# Patient Record
Sex: Male | Born: 1990
Health system: Southern US, Community
[De-identification: ages and names within clinical notes are randomized; demographics above are authoritative.]

## PROBLEM LIST (undated history)

## (undated) DIAGNOSIS — Z8659 Personal history of other mental and behavioral disorders: Secondary | ICD-10-CM

## (undated) DIAGNOSIS — F419 Anxiety disorder, unspecified: Secondary | ICD-10-CM

## (undated) HISTORY — DX: Personal history of other mental and behavioral disorders: Z86.59

## (undated) HISTORY — PX: WRIST SURGERY: SHX841

## (undated) HISTORY — PX: OTHER SURGICAL HISTORY: SHX169

---

## 2012-08-17 ENCOUNTER — Emergency Department
Admission: EM | Admit: 2012-08-17 | Discharge: 2012-08-17 | Disposition: A | Discharge status: Home / Self Care | Payer: No Typology Code available for payment source | Source: Home / Self Care | Attending: Family Medicine | Admitting: Family Medicine

## 2012-08-17 ENCOUNTER — Emergency Department (INDEPENDENT_AMBULATORY_CARE_PROVIDER_SITE_OTHER): Payer: No Typology Code available for payment source

## 2012-08-17 DIAGNOSIS — S93402A Sprain of unspecified ligament of left ankle, initial encounter: Secondary | ICD-10-CM

## 2012-08-17 DIAGNOSIS — S92309A Fracture of unspecified metatarsal bone(s), unspecified foot, initial encounter for closed fracture: Secondary | ICD-10-CM

## 2012-08-17 DIAGNOSIS — X58XXXA Exposure to other specified factors, initial encounter: Secondary | ICD-10-CM

## 2012-08-17 DIAGNOSIS — M79609 Pain in unspecified limb: Secondary | ICD-10-CM

## 2012-08-17 DIAGNOSIS — S92302A Fracture of unspecified metatarsal bone(s), left foot, initial encounter for closed fracture: Secondary | ICD-10-CM

## 2012-08-17 DIAGNOSIS — S93409A Sprain of unspecified ligament of unspecified ankle, initial encounter: Secondary | ICD-10-CM

## 2012-08-17 NOTE — ED Notes (Signed)
Mitchell Shaw was playing basketball yesterday when he twisted his left foot. The pain is a 8/10, stabbing and throbbing pain. His left foot is swollen and is unable to walk on his foot.

## 2012-08-17 NOTE — ED Provider Notes (Signed)
History    CSN: [id number] Arrival date & time 08/17/12  1323  First MD Initiated Contact with Patient 08/17/12 1355     Chief Complaint  Patient presents with  . Foot Injury    yesterday      HPI Comments: While playing basketball yesterday, patient inverted his left foot/ankle and felt a sudden popping sensation in his left foot/ankle.  He has had persistent swelling and pain with weight bearing.  Patient is a 22 y.o. male presenting with foot injury. The history is provided by the patient.  Foot Injury Location:  Foot and ankle Time since incident:  1 day Injury: yes   Mechanism of injury comment:  Playing basketball Ankle location:  L ankle Foot location:  L foot Pain details:    Quality:  Throbbing and shooting   Radiates to:  Does not radiate   Severity:  Moderate   Onset quality:  Sudden   Duration:  1 day   Timing:  Constant   Progression:  Unchanged Chronicity:  New Dislocation: no   Prior injury to area:  No Relieved by:  Nothing Worsened by:  Bearing weight Ineffective treatments:  None tried Associated symptoms: decreased ROM, stiffness and swelling   Associated symptoms: no back pain, no fatigue, no muscle weakness, no numbness and no tingling    History reviewed. No pertinent past medical history. Past Surgical History  Procedure Laterality Date  . Bone graph     Family History  Problem Relation Age of Onset  . Rheum arthritis Mother   . Hypertension Other   . Stroke Other   . Hypertension Other    History  Substance Use Topics  . Smoking status: Current Every Day Smoker -- 1.00 packs/day for 6 years    Types: Cigarettes  . Smokeless tobacco: Never Used  . Alcohol Use: Yes    Review of Systems  Constitutional: Negative for fatigue.  Musculoskeletal: Positive for stiffness. Negative for back pain.  All other systems reviewed and are negative.    Allergies  Review of patient's allergies indicates no known allergies.  Home Medications     Current Outpatient Rx  Name  Route  Sig  Dispense  Refill  . ALPRAZolam (XANAX) 1 MG tablet   Oral   Take 1 mg by mouth at bedtime as needed for sleep.          BP 131/90  Pulse 99  Temp(Src) 98.4 F (36.9 C) (Oral)  Ht 6\' 1"  (1.854 m)  Wt 160 lb (72.576 kg)  BMI 21.11 kg/m2  SpO2 99% Physical Exam  Nursing note and vitals reviewed. Constitutional: He is oriented to person, place, and time. He appears well-developed and well-nourished. No distress.  HENT:  Head: Atraumatic.  Eyes: Conjunctivae are normal. Pupils are equal, round, and reactive to light.  Musculoskeletal: He exhibits tenderness.       Left ankle: He exhibits decreased range of motion and swelling. He exhibits no ecchymosis, no deformity, no laceration and normal pulse. Tenderness. Lateral malleolus, medial malleolus, AITFL and head of 5th metatarsal tenderness found. No CF ligament, no posterior TFL and no proximal fibula tenderness found. Achilles tendon normal.       Left foot: He exhibits decreased range of motion, tenderness, bony tenderness and swelling. He exhibits normal capillary refill, no crepitus, no deformity and no laceration.       Feet:  Tenderness/swelling ankle/foot as noted on diagram.  Distal neurovascular function is intact.   Neurological: He is  alert and oriented to person, place, and time.  Skin: Skin is warm and dry. No rash noted.    ED Course  Procedures  None    Dg Ankle Complete Left  08/17/2012   *RADIOLOGY REPORT*  Clinical Data: Pain post trauma  LEFT ANKLE COMPLETE - 3+ VIEW  Comparison: None.  Findings: Frontal, oblique, and lateral views were obtained.  There is a small avulsion arising from the proximal fifth metatarsal.  No other evidence of fracture.  No effusion.  Ankle mortise appears intact.  Impression: Small avulsion arising from proximal fifth metatarsal. No other evidence of fracture.  Mortise intact.   Original Report Authenticated By: Bretta Bang, M.D.   Dg  Foot Complete Left  08/17/2012   *RADIOLOGY REPORT*  Clinical Data: Foot/ankle injury  LEFT FOOT - COMPLETE 3+ VIEW  Comparison: concurrently obtained radiographs of the ankle  Findings: The fracture fragment at the base of the fifth metatarsal is better seen on the concurrently obtained radiographs of the ankle.  No definite a avulsion fracture is seen on these views. There is a small os peroneus as well as some bony fragments adjacent to the distal lateral calcaneus.  This could represent an avulsion fracture from the origin of the extensor digitorum brevis.  IMPRESSION:  Small bony fragments adjacent to the distal lateral calcaneus in the region of the origin of the extensor digitorum brevis suggestive of an avulsion fracture.  The avulsion fracture at the base of the fifth metatarsal better demonstrated on prior radiographs of the ankle.  Incidental note is made of an os peroneum.   Original Report Authenticated By: Malachy Moan, M.D.   1. Avulsion fracture of metatarsal bone of left foot;  ?Avulsion fracture also of distal lateral calcaneus   2. Left ankle sprain, initial encounter     MDM  Cam Walker applied.  Use crutches (already has a pair) Apply ice pack for 30 minutes every 1 to 2 hours today and tomorrow.  Elevate.  Wear cast boot and use crutches until evaluated by Dr. Rodney Langton.  Recommend daily intake of 1000mg  calcium and 600 units Vitamin D. Followup with Dr. Rodney Langton in 5 days.  Lattie Haw, MD 08/17/12 [phone]

## 2012-08-19 ENCOUNTER — Telehealth: Payer: Self-pay

## 2012-08-19 NOTE — ED Notes (Signed)
I called and spoke with patient and he is doing better. I advised to call back if anything changes or if he has questions or concerns. He states he will keep his appointment with Dr Benjamin Stain.

## 2012-08-22 ENCOUNTER — Ambulatory Visit (INDEPENDENT_AMBULATORY_CARE_PROVIDER_SITE_OTHER): Payer: No Typology Code available for payment source | Admitting: Sports Medicine

## 2012-08-22 ENCOUNTER — Encounter: Payer: Self-pay | Admitting: Sports Medicine

## 2012-08-22 VITALS — BP 153/85 | HR 90 | Wt 158.0 lb

## 2012-08-22 DIAGNOSIS — S92002A Unspecified fracture of left calcaneus, initial encounter for closed fracture: Secondary | ICD-10-CM | POA: Insufficient documentation

## 2012-08-22 DIAGNOSIS — S92309A Fracture of unspecified metatarsal bone(s), unspecified foot, initial encounter for closed fracture: Secondary | ICD-10-CM

## 2012-08-22 DIAGNOSIS — S92009A Unspecified fracture of unspecified calcaneus, initial encounter for closed fracture: Secondary | ICD-10-CM

## 2012-08-22 DIAGNOSIS — S92352A Displaced fracture of fifth metatarsal bone, left foot, initial encounter for closed fracture: Secondary | ICD-10-CM | POA: Insufficient documentation

## 2012-08-22 MED ORDER — DOXYCYCLINE HYCLATE 100 MG PO TABS: 100.0000 mg | ORAL_TABLET | Freq: Two times a day (BID) | ORAL | Status: AC

## 2012-08-22 MED ORDER — HYDROCODONE-ACETAMINOPHEN 10-325 MG PO TABS: 1.0000 | ORAL_TABLET | Freq: Three times a day (TID) | ORAL | Status: DC | PRN

## 2012-08-22 NOTE — Assessment & Plan Note (Signed)
Continue Cam Boot. Nonweightbearing. Hydrocodone for pain. Return in 2 weeks, x-ray before visit.  I billed a fracture code for this visit, all subsequent visits for this complaint will be "post-op checks" in the global period.

## 2012-08-22 NOTE — Progress Notes (Signed)
   Subjective:    I'm seeing this patient as a consultation for:  Dr. Cathren Harsh  CC: Left foot pain  HPI: This is a very pleasant 22 year old male, he rolled his left ankle several days ago. He had immediate pain, swelling, bruising, and inability to cannulate. He went to urgent care where x-rays showed a fracture, and he was placed in a cam boot. He declined any pain medication at that time. He has been in the but since then, but continues to have significant pain. He is also still fairly swollen. Pain is localized over the medial ankle/deltoid ligament, as well as the lateral ankle. It is moderate, persistent. Worse with weightbearing, movement, and palpation.  Past medical history, Surgical history, Family history not pertinant except as noted below, Social history, Allergies, and medications have been entered into the medical record, reviewed, and no changes needed.   Review of Systems: No headache, visual changes, nausea, vomiting, diarrhea, constipation, dizziness, abdominal pain, skin rash, fevers, chills, night sweats, weight loss, swollen lymph nodes, body aches, joint swelling, muscle aches, chest pain, shortness of breath, mood changes, visual or auditory hallucinations.   Objective:   General: Well Developed, well nourished, and in no acute distress.  Neuro/Psych: Alert and oriented x3, extra-ocular muscles intact, able to move all 4 extremities, sensation grossly intact. Skin: Warm and dry, no rashes noted.  Respiratory: Not using accessory muscles, speaking in full sentences, trachea midline.  Cardiovascular: Pulses palpable, no extremity edema. Abdomen: Does not appear distended. Left Ankle: Visible swelling and bruising, his range of motion and strength are limited by pain. Stable lateral and medial ligaments; squeeze test and kleiger test unremarkable; Talar dome nontender; Tender to palpation over the lateral distal calcaneus, as well as the base of the fifth metatarsal. No  tenderness on posterior aspects of lateral and medial malleolus No sign of peroneal tendon subluxations or tenderness to palpation Negative tarsal tunnel tinel's  X-rays are reviewed and show an avulsion fracture from the base of the fifth metatarsal as well as an avulsion from the distal lateral calcaneus.  Foot was strapped with compressive dressing.  Impression and Recommendations:   This case required medical decision making of moderate complexity.

## 2012-08-22 NOTE — Assessment & Plan Note (Signed)
Continue Cam Boot. Nonweightbearing. Hydrocodone for pain. Return in 2 weeks, x-ray before visit.  I billed a fracture code for this visit, all subsequent visits for this complaint will be "post-op checks" in the global period. 

## 2012-08-23 ENCOUNTER — Telehealth: Payer: Self-pay

## 2012-08-23 NOTE — Telephone Encounter (Signed)
Called [employer] to start PA for MR Ankle Left W/O contrast. Order is still pending. Dewey Viens,CMA

## 2012-08-29 ENCOUNTER — Telehealth: Payer: Self-pay | Admitting: *Deleted

## 2012-08-29 NOTE — Telephone Encounter (Signed)
Prior auth obatined for MRI left ankle through [place].  Auth # [id number] good from 08-30-12 until 09-12-12.  Imaging notified.

## 2012-08-30 ENCOUNTER — Telehealth: Payer: Self-pay

## 2012-08-30 NOTE — Telephone Encounter (Signed)
Oh no, cancel MRI, just needs Xray before next visit!

## 2012-08-30 NOTE — Telephone Encounter (Signed)
Pt mother called wants to know why pt has to have an MRI done when he already had an Xray done and it is healing?

## 2012-09-02 NOTE — Telephone Encounter (Signed)
Pt notified of cancel MRI and told to have Xray done before visit. Rhonda Cunningham,CMA

## 2012-09-05 ENCOUNTER — Ambulatory Visit (INDEPENDENT_AMBULATORY_CARE_PROVIDER_SITE_OTHER): Payer: No Typology Code available for payment source | Admitting: Sports Medicine

## 2012-09-05 ENCOUNTER — Ambulatory Visit (INDEPENDENT_AMBULATORY_CARE_PROVIDER_SITE_OTHER): Payer: No Typology Code available for payment source

## 2012-09-05 ENCOUNTER — Encounter: Payer: Self-pay | Admitting: Sports Medicine

## 2012-09-05 ENCOUNTER — Ambulatory Visit: Payer: No Typology Code available for payment source | Admitting: Sports Medicine

## 2012-09-05 VITALS — BP 144/80 | HR 102 | Wt 160.0 lb

## 2012-09-05 DIAGNOSIS — S92002A Unspecified fracture of left calcaneus, initial encounter for closed fracture: Secondary | ICD-10-CM

## 2012-09-05 DIAGNOSIS — S92002D Unspecified fracture of left calcaneus, subsequent encounter for fracture with routine healing: Secondary | ICD-10-CM

## 2012-09-05 DIAGNOSIS — S92309A Fracture of unspecified metatarsal bone(s), unspecified foot, initial encounter for closed fracture: Secondary | ICD-10-CM

## 2012-09-05 DIAGNOSIS — IMO0001 Reserved for inherently not codable concepts without codable children: Secondary | ICD-10-CM

## 2012-09-05 DIAGNOSIS — Z4789 Encounter for other orthopedic aftercare: Secondary | ICD-10-CM

## 2012-09-05 DIAGNOSIS — S92352A Displaced fracture of fifth metatarsal bone, left foot, initial encounter for closed fracture: Secondary | ICD-10-CM

## 2012-09-05 NOTE — Assessment & Plan Note (Signed)
Discontinue Cam Boot, transition into ASO and regular shoe. Home exercises. Return in 2 weeks, I will likely clear him at that point.

## 2012-09-05 NOTE — Assessment & Plan Note (Signed)
Discontinue Cam Boot, transition into ASO and regular shoe. Home exercises. Return in 2 weeks, I will likely clear him at that point.  

## 2012-09-05 NOTE — Progress Notes (Signed)
  Subjective: 2 weeks status post fracture through the lateral calcaneus as well as base of the fifth metatarsal. He has been in a [employer], his overall pain free. Still a little bit weak.   Objective: General: Well-developed, well-nourished, and in no acute distress. Left Ankle: No visible erythema or swelling. Range of motion is full in all directions. Strength is 5/5 in all directions. Stable lateral and medial ligaments; squeeze test and kleiger test unremarkable; Talar dome nontender; No pain at base of 5th MT; No tenderness over cuboid; No tenderness over N spot or navicular prominence No tenderness on posterior aspects of lateral and medial malleolus No sign of peroneal tendon subluxations or tenderness to palpation Negative tarsal tunnel tinel's Able to walk 4 steps.  X-rays reviewed I can no longer see fracture from the base of the fifth metatarsal, I can still see the small avulsion from the lateral calcaneus but this is no longer tender to palpation.  Assessment/plan:

## 2012-09-09 ENCOUNTER — Telehealth: Payer: Self-pay | Admitting: *Deleted

## 2012-09-09 NOTE — Telephone Encounter (Signed)
Mitchell Shaw with [facility] calls to let you know that this patinet dad called and cancelled his MRI that was scheduled for tomorrow. Barry Dienes, LPN

## 2012-09-09 NOTE — Telephone Encounter (Signed)
Helen with [facility] in [place] called to let you know that patient dad canceled this MRI that was scheduled for tomorrow. Barry Dienes, LPN

## 2012-09-10 ENCOUNTER — Other Ambulatory Visit: Payer: No Typology Code available for payment source

## 2012-09-12 ENCOUNTER — Ambulatory Visit: Payer: No Typology Code available for payment source | Admitting: Sports Medicine

## 2012-09-19 ENCOUNTER — Ambulatory Visit: Payer: No Typology Code available for payment source | Admitting: Sports Medicine

## 2012-09-25 ENCOUNTER — Encounter: Payer: Self-pay | Admitting: Sports Medicine

## 2012-09-25 ENCOUNTER — Ambulatory Visit (INDEPENDENT_AMBULATORY_CARE_PROVIDER_SITE_OTHER): Payer: Commercial Managed Care - PPO | Admitting: Sports Medicine

## 2012-09-25 VITALS — BP 145/88 | HR 86 | Wt 159.0 lb

## 2012-09-25 DIAGNOSIS — S92352A Displaced fracture of fifth metatarsal bone, left foot, initial encounter for closed fracture: Secondary | ICD-10-CM

## 2012-09-25 DIAGNOSIS — IMO0001 Reserved for inherently not codable concepts without codable children: Secondary | ICD-10-CM

## 2012-09-25 DIAGNOSIS — S92002D Unspecified fracture of left calcaneus, subsequent encounter for fracture with routine healing: Secondary | ICD-10-CM

## 2012-09-25 DIAGNOSIS — S92309A Fracture of unspecified metatarsal bone(s), unspecified foot, initial encounter for closed fracture: Secondary | ICD-10-CM

## 2012-09-25 NOTE — Assessment & Plan Note (Signed)
Pain has resolved. There's still a touch of tenderness at the metacarpophalangeal joint of the fifth digit. Celebrex samples given, to come back to see me in a few weeks we can inject the joint if no better.

## 2012-09-25 NOTE — Progress Notes (Signed)
  Subjective: Mitchell Shaw is now 6 weeks status post fractures of the calcaneus as well as base of the fifth metatarsal bone. He is pain-free at the fracture site, he does have some pain at the fifth metatarsophalangeal joint .  Objective: General: Well-developed, well-nourished, and in no acute distress.  no tenderness to palpation over the fracture site, mild tenderness with mild palpable synovitis at the fifth metatarsophalangeal joint.  Assessment/plan:

## 2012-09-25 NOTE — Assessment & Plan Note (Signed)
Resolved

## 2012-11-12 ENCOUNTER — Emergency Department
Admission: EM | Admit: 2012-11-12 | Discharge: 2012-11-12 | Disposition: A | Discharge status: Home / Self Care | Payer: Commercial Managed Care - PPO | Source: Home / Self Care | Attending: Family Medicine | Admitting: Family Medicine

## 2012-11-12 ENCOUNTER — Emergency Department (INDEPENDENT_AMBULATORY_CARE_PROVIDER_SITE_OTHER): Payer: Commercial Managed Care - PPO

## 2012-11-12 ENCOUNTER — Encounter: Payer: Self-pay | Admitting: *Deleted

## 2012-11-12 DIAGNOSIS — S62308A Unspecified fracture of other metacarpal bone, initial encounter for closed fracture: Secondary | ICD-10-CM

## 2012-11-12 DIAGNOSIS — X58XXXA Exposure to other specified factors, initial encounter: Secondary | ICD-10-CM

## 2012-11-12 DIAGNOSIS — S62309A Unspecified fracture of unspecified metacarpal bone, initial encounter for closed fracture: Secondary | ICD-10-CM

## 2012-11-12 DIAGNOSIS — S62339A Displaced fracture of neck of unspecified metacarpal bone, initial encounter for closed fracture: Secondary | ICD-10-CM

## 2012-11-12 HISTORY — DX: Anxiety disorder, unspecified: F41.9

## 2012-11-12 NOTE — ED Provider Notes (Signed)
CSN: [id number]     Arrival date & time 11/12/12  1704 History   First MD Initiated Contact with Patient 11/12/12 1704     Chief Complaint  Patient presents with  . Hand Injury    HPI  Hand pain x 1 week Pt was playing basketball, fell and mildly injured his shoulder.  Pt was angry and hit the ground with his hand.  Has had persistent R ulnar sided hand pain and swelling since this point.  Pain predominantly over 5th metacarpal.    Past Medical History  Diagnosis Date  . Anxiety    Past Surgical History  Procedure Laterality Date  . Bone graph     Family History  Problem Relation Age of Onset  . Rheum arthritis Mother   . Hypertension Other   . Stroke Other   . Hypertension Other   . Rheum arthritis Father   . Rheum arthritis Brother    History  Substance Use Topics  . Smoking status: Current Every Day Smoker -- 1.00 packs/day for 6 years    Types: Cigarettes  . Smokeless tobacco: Never Used  . Alcohol Use: Yes    Review of Systems  All other systems reviewed and are negative.    Allergies  Review of patient's allergies indicates no known allergies.  Home Medications   Current Outpatient Rx  Name  Route  Sig  Dispense  Refill  . ALPRAZolam (XANAX) 1 MG tablet   Oral   Take 1 mg by mouth at bedtime as needed for sleep.          BP 141/78  Pulse 87  Temp(Src) 98.2 F (36.8 C) (Oral)  Resp 16  Wt 156 lb (70.761 kg)  BMI 20.59 kg/m2  SpO2 97% Physical Exam  Constitutional: He is oriented to person, place, and time. He appears well-developed and well-nourished.  HENT:  Head: Normocephalic and atraumatic.  Eyes: Conjunctivae are normal. Pupils are equal, round, and reactive to light.  Neck: Normal range of motion.  Cardiovascular: Normal rate and regular rhythm.   Pulmonary/Chest: Effort normal and breath sounds normal.  Abdominal: Soft.  Musculoskeletal:       Hands: Neurological: He is alert and oriented to person, place, and time.    ED  Course  Procedures (including critical care time) Labs Review Labs Reviewed - No data to display Imaging Review Dg Hand Complete Right  11/12/2012   CLINICAL DATA:  Injury to right hand with pain along 5th digit.  EXAM: RIGHT HAND - COMPLETE 3+ VIEW  COMPARISON:  None.  FINDINGS: There is a mildly displaced and angulated fracture involving the distal 5th metacarpal. A fixation screw is present through the scaphoid with no evidence of nonunion or abnormal lucency. No significant arthropathy or soft tissue abnormalities are identified.  IMPRESSION: Acute fracture of the distal 5th metacarpal demonstrating mild displacement and angulation.   Electronically Signed   By: Irish Lack   On: 11/12/2012 17:50    MDM   1. Closed fracture of 5th metacarpal, initial encounter    Ulnar gutter splint placed.  RICE and NSAIDs.  Plan for follow up with sports medicine in am.  Discussed general care and MSK red flags.  Follow up as needed.     The patient and/or caregiver has been counseled thoroughly with regard to treatment plan and/or medications prescribed including dosage, schedule, interactions, rationale for use, and possible side effects and they verbalize understanding. Diagnoses and expected course of recovery discussed and will  return if not improved as expected or if the condition worsens. Patient and/or caregiver verbalized understanding.         Doree Albee, MD 11/12/12 [phone]

## 2012-11-12 NOTE — ED Notes (Signed)
Pt c/o RT hand injury x 1wk ago. He reports punching the floor during a basketball game.

## 2012-11-13 ENCOUNTER — Encounter: Payer: Self-pay | Admitting: Sports Medicine

## 2012-11-13 ENCOUNTER — Ambulatory Visit (INDEPENDENT_AMBULATORY_CARE_PROVIDER_SITE_OTHER): Payer: Commercial Managed Care - PPO | Admitting: Sports Medicine

## 2012-11-13 ENCOUNTER — Ambulatory Visit (INDEPENDENT_AMBULATORY_CARE_PROVIDER_SITE_OTHER): Payer: Commercial Managed Care - PPO

## 2012-11-13 VITALS — BP 147/81 | HR 94 | Wt 157.0 lb

## 2012-11-13 DIAGNOSIS — S62309A Unspecified fracture of unspecified metacarpal bone, initial encounter for closed fracture: Secondary | ICD-10-CM

## 2012-11-13 DIAGNOSIS — IMO0001 Reserved for inherently not codable concepts without codable children: Secondary | ICD-10-CM

## 2012-11-13 DIAGNOSIS — S62339A Displaced fracture of neck of unspecified metacarpal bone, initial encounter for closed fracture: Secondary | ICD-10-CM | POA: Insufficient documentation

## 2012-11-13 MED ORDER — HYDROCODONE-ACETAMINOPHEN 5-325 MG PO TABS: 1.0000 | ORAL_TABLET | Freq: Three times a day (TID) | ORAL | Status: DC | PRN

## 2012-11-13 NOTE — Addendum Note (Signed)
Addended by: Monica Becton on: 11/13/2012 01:37 PM   Modules accepted: Orders

## 2012-11-13 NOTE — Assessment & Plan Note (Addendum)
Borderline at 38 of angulation apex dorsal. Reduction performed under hematoma block. Ulnar gutter splint placed. Return in one week for x-rays to recheck alignment.  I billed a fracture code for this visit, all subsequent visits for this complaint will be "post-op checks" in the global period.

## 2012-11-13 NOTE — Progress Notes (Addendum)
   Subjective:    I'm seeing this patient as a consultation for:  Dr. Alvester Morin  CC: Right hand pain.  HPI: This is a very pleasant 22 year old male, one week ago he punched the ground, and suffered a fracture of his fifth metacarpal. He was seen in urgent care yesterday, x-ray showed a boxer's fracture, angulated, of the fifth metacarpal. He was referred to me for further evaluation and definitive treatment. Pain is moderate, persistent, local ice. No radiation.  Foot fractures: These have completely healed and pain has resolved since he last saw me.  Past medical history, Surgical history, Family history not pertinant except as noted below, Social history, Allergies, and medications have been entered into the medical record, reviewed, and no changes needed.   Review of Systems: No headache, visual changes, nausea, vomiting, diarrhea, constipation, dizziness, abdominal pain, skin rash, fevers, chills, night sweats, weight loss, swollen lymph nodes, body aches, joint swelling, muscle aches, chest pain, shortness of breath, mood changes, visual or auditory hallucinations.   Objective:   General: Well Developed, well nourished, and in no acute distress.  Neuro/Psych: Alert and oriented x3, extra-ocular muscles intact, able to move all 4 extremities, sensation grossly intact. Skin: Warm and dry, no rashes noted.  Respiratory: Not using accessory muscles, speaking in full sentences, trachea midline.  Cardiovascular: Pulses palpable, no extremity edema. Abdomen: Does not appear distended. Right hand: There is swelling, bruising, and visible deformity at the fifth knuckle. He is neurovascularly intact distally, all fingers point at the thenar eminence and there's no scissoring.  X-rays were reviewed personally, there is a apex dorsal angulated fracture of the distal fifth metacarpal bone with about 38 of angulation.  Procedure:  Fracture reduction. Risks, benefits, and alternatives explained and  consent obtained. Time out conducted. Surface cleaned with alcohol. 5cc lidocaine infiltrated around fracture in a hematoma block. Adequate anesthesia ensured. Fracture reduction procedure: The fracture fragments were grasped firmly, a volar directed force was applied to the fracture at the fifth metacarpal, and fragments were felt to have returned to anatomic alignment. Pt stable. Aftercare and follow-up advised.  Postreduction films reviewed and noted improved/near anatomic alignment.  Ulnar gutter splint was then replaced.  Impression and Recommendations:   This case required medical decision making of moderate complexity.

## 2012-11-15 ENCOUNTER — Telehealth: Payer: Self-pay | Admitting: Emergency Medicine

## 2012-11-20 ENCOUNTER — Encounter: Payer: Self-pay | Admitting: Sports Medicine

## 2012-11-20 ENCOUNTER — Ambulatory Visit (INDEPENDENT_AMBULATORY_CARE_PROVIDER_SITE_OTHER): Payer: Commercial Managed Care - PPO | Admitting: Sports Medicine

## 2012-11-20 ENCOUNTER — Ambulatory Visit (INDEPENDENT_AMBULATORY_CARE_PROVIDER_SITE_OTHER): Payer: Commercial Managed Care - PPO

## 2012-11-20 VITALS — BP 124/63 | HR 72 | Wt 153.0 lb

## 2012-11-20 DIAGNOSIS — IMO0001 Reserved for inherently not codable concepts without codable children: Secondary | ICD-10-CM

## 2012-11-20 DIAGNOSIS — S62309A Unspecified fracture of unspecified metacarpal bone, initial encounter for closed fracture: Secondary | ICD-10-CM

## 2012-11-20 DIAGNOSIS — S62339A Displaced fracture of neck of unspecified metacarpal bone, initial encounter for closed fracture: Secondary | ICD-10-CM

## 2012-11-20 DIAGNOSIS — X58XXXA Exposure to other specified factors, initial encounter: Secondary | ICD-10-CM

## 2012-11-20 NOTE — Progress Notes (Signed)
  Subjective: Recheck boxer's fracture, one week out from closed reduction, he has been working in his ulnar gutter splint but working with concrete, and [employer].   Objective: General: Well-developed, well-nourished, and in no acute distress. Unrtunately there does appear to be some increased angulation.  X-rays did show loss of reduction, but angulation is about 30 apex dorsal which is acceptable.  Cast placed.  Assessment/plan:

## 2012-11-20 NOTE — Assessment & Plan Note (Signed)
Unfortunately one week after reduction there has been some increasing angulation, it is approximately 30. This is acceptable. Cast placed. Return in 2 weeks.

## 2012-12-04 ENCOUNTER — Ambulatory Visit: Payer: Commercial Managed Care - PPO | Admitting: Sports Medicine

## 2014-09-23 IMAGING — CR DG HAND COMPLETE 3+V*R*
3 series · 3 of 3 positions shown · non-contrast
Comparison: 11/12/2012 at 1983 hrs.

CLINICAL DATA: 21-year-old male status post reduction of 5th
metacarpal.

EXAM:
RIGHT HAND - COMPLETE 3+ VIEW

[view not recorded (1 of 3)]
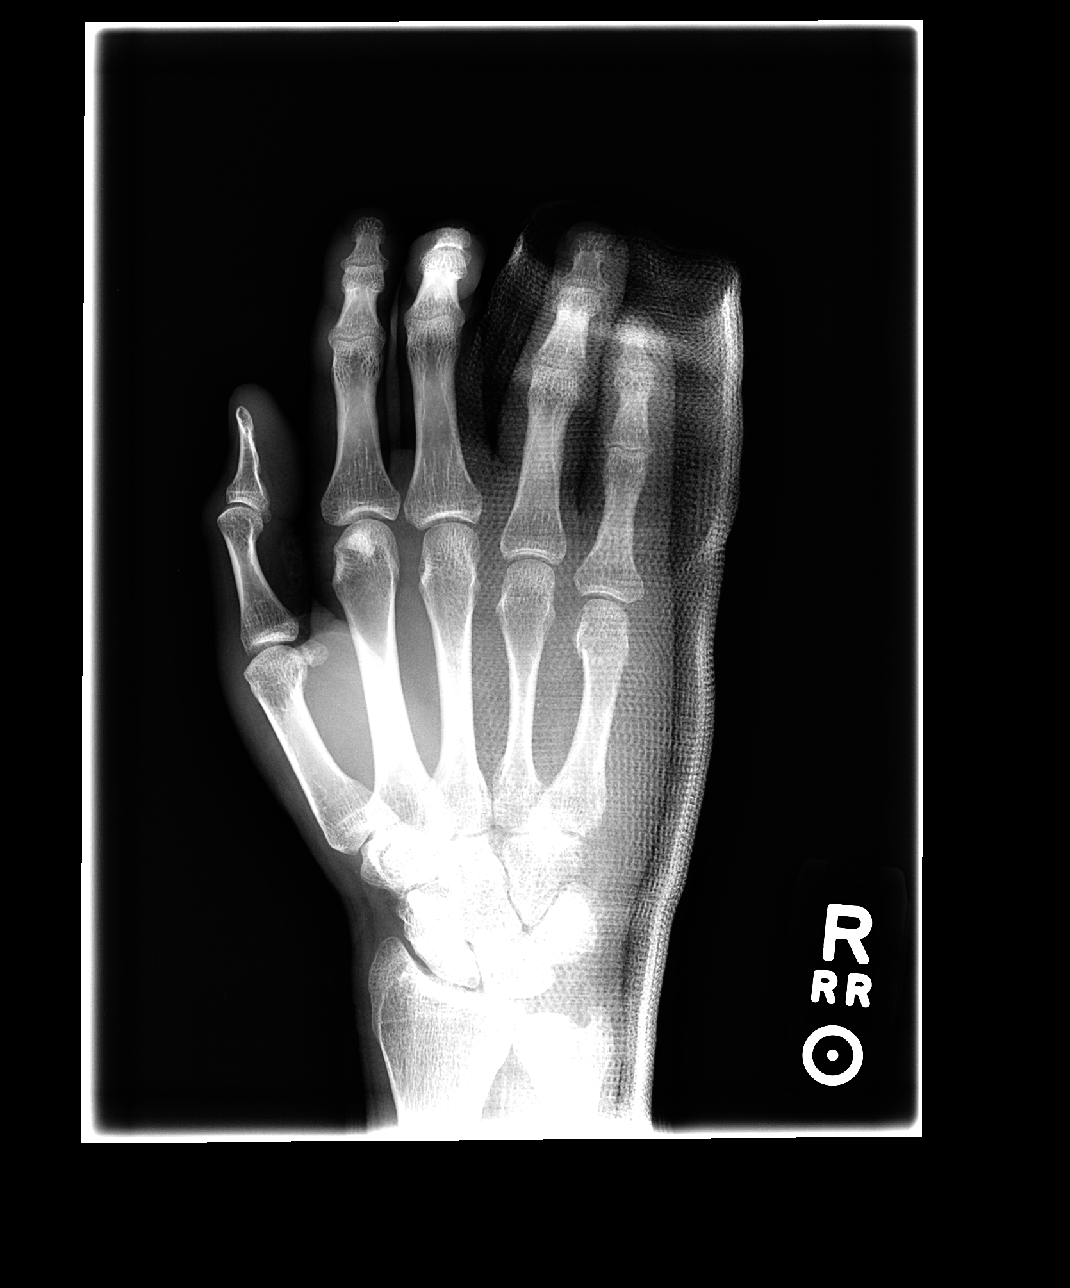

[view not recorded (2 of 3)]
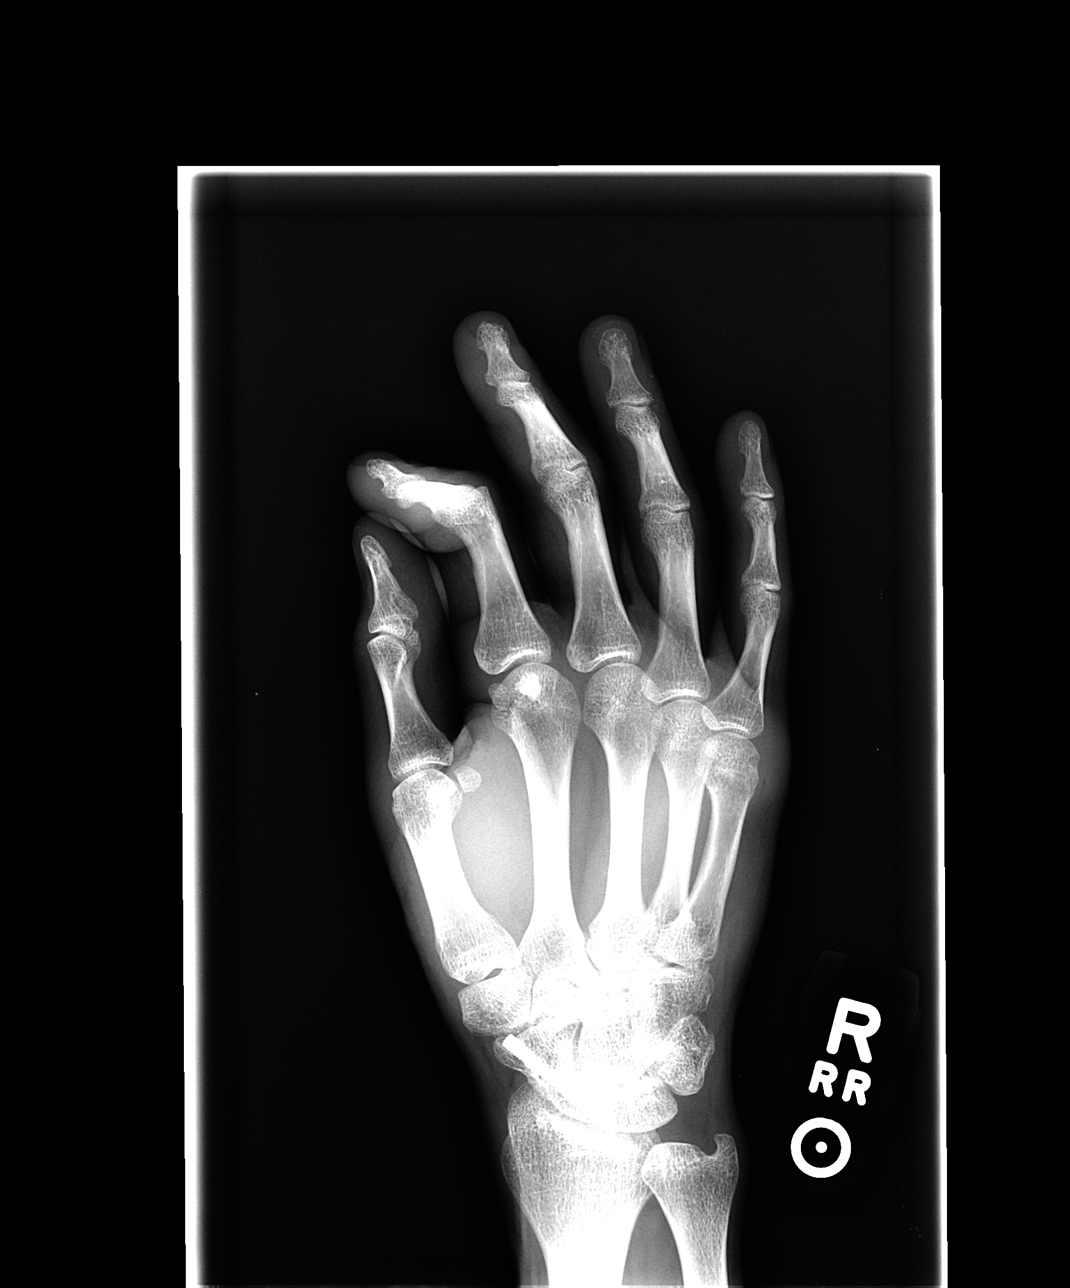

[view not recorded (3 of 3)]
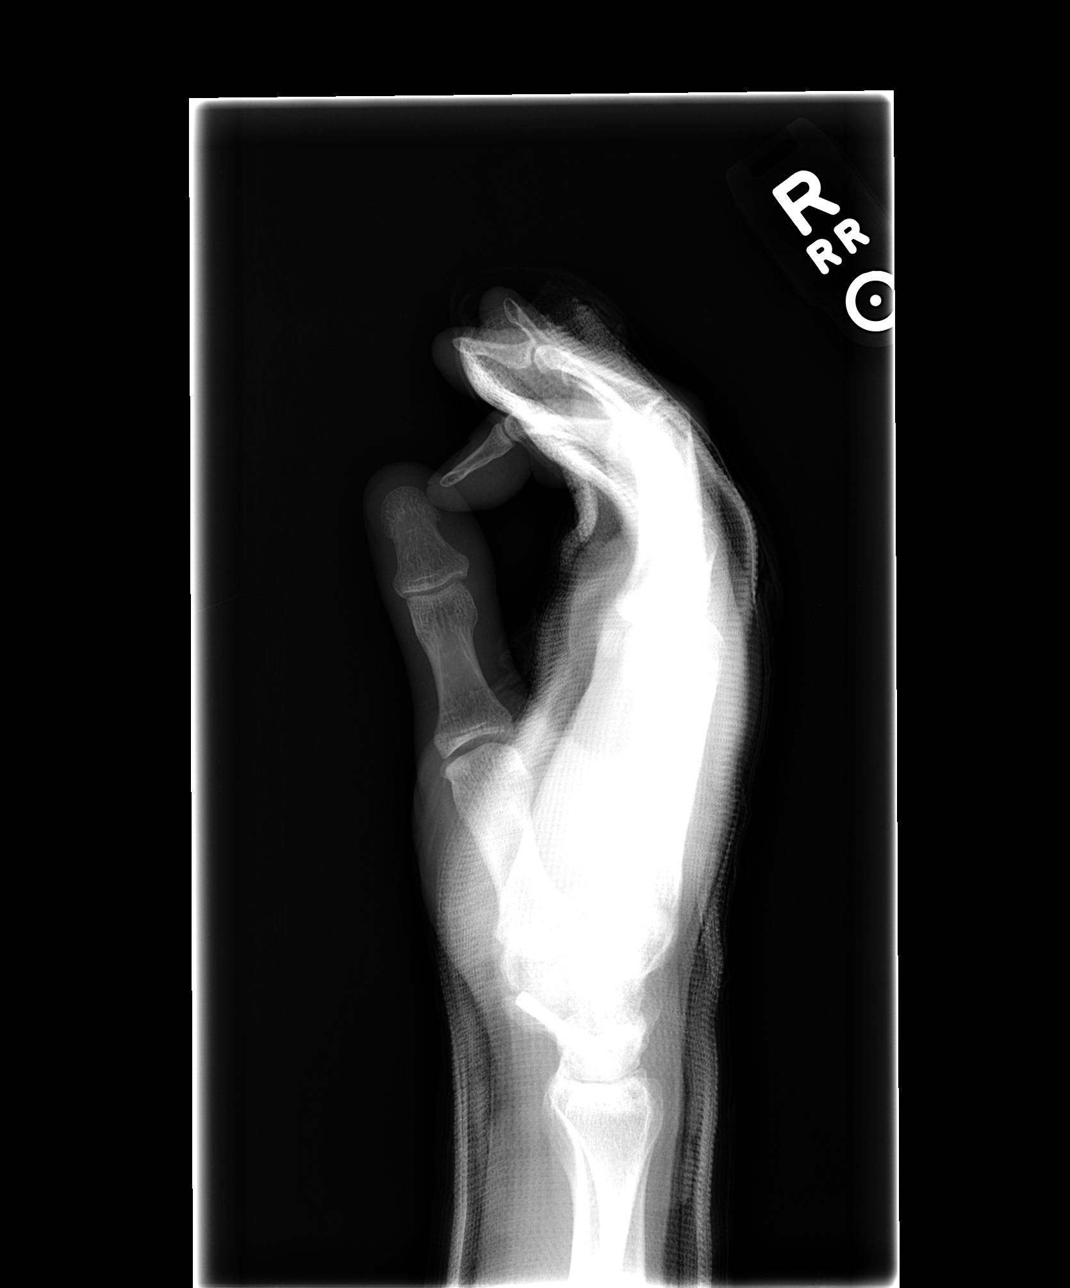

[3 of 3 positions shown; findings below may reference images not displayed]

FINDINGS: Splint material about the ulnar aspect of the right hand on some
views. Improved alignment of the fracture fragments about the distal
5th metacarpal fracture. Near anatomic, except for mild volar
angulation of the distal fragment. No new osseous injury identified.
Post ORIF changes at the right scaphoid.
IMPRESSION: Improved alignment of the distal 5th metacarpal fracture status post
splinting.

## 2015-05-18 ENCOUNTER — Ambulatory Visit (INDEPENDENT_AMBULATORY_CARE_PROVIDER_SITE_OTHER): Payer: 59

## 2015-05-18 ENCOUNTER — Ambulatory Visit: Payer: Self-pay | Admitting: Family Medicine

## 2015-05-18 ENCOUNTER — Encounter: Payer: Self-pay | Admitting: Family Medicine

## 2015-05-18 ENCOUNTER — Ambulatory Visit (INDEPENDENT_AMBULATORY_CARE_PROVIDER_SITE_OTHER): Payer: 59 | Admitting: Family Medicine

## 2015-05-18 VITALS — BP 147/80 | HR 79 | Wt 154.0 lb

## 2015-05-18 DIAGNOSIS — M25542 Pain in joints of left hand: Secondary | ICD-10-CM

## 2015-05-18 DIAGNOSIS — S6992XA Unspecified injury of left wrist, hand and finger(s), initial encounter: Secondary | ICD-10-CM

## 2015-05-18 NOTE — Progress Notes (Signed)
   Marikay AlarDavid Holcomb is a 25 y.o. male who [facility] for left thumb injury. Patient was in his normal state of health today when he suffered a crush injury to his left thumb. He was helping a friend move a couch when his thumb became crushed against a wall. He notes significant pain at the MCP especially on the radial side of the thumb. He denies any radiating pain weakness or numbness.   Past Medical History  Diagnosis Date  . Anxiety    Past Surgical History  Procedure Laterality Date  . Bone graph     Social History  Substance Use Topics  . Smoking status: Current Every Day Smoker -- 1.00 packs/day for 6 years    Types: Cigarettes  . Smokeless tobacco: Never Used  . Alcohol Use: Yes   family history includes Hypertension in his other and other; Rheum arthritis in his brother, father, and mother; Stroke in his other.  ROS:  No headache, visual changes, nausea, vomiting, diarrhea, constipation, dizziness, abdominal pain, skin rash, fevers, chills, night sweats, weight loss, swollen lymph nodes, body aches, joint swelling, muscle aches, chest pain, shortness of breath, mood changes, visual or auditory hallucinations.    Medications: No current outpatient prescriptions on file.   No current facility-administered medications for this visit.   No Known Allergies   Exam:  BP 147/80 mmHg  Pulse 79  Wt 154 lb (69.854 kg) General: Well Developed, well nourished, and in no acute distress.  Neuro/Psych: Alert and oriented x3, extra-ocular muscles intact, able to move all 4 extremities, sensation grossly intact. Skin: Warm and dry, no rashes noted.  Respiratory: Not using accessory muscles, speaking in full sentences, trachea midline.  Cardiovascular: Pulses palpable, no extremity edema. Abdomen: Does not appear distended. MSK: Left hand is normal appearing without any significant deformity. Mildly swollen and tender to  palpation at the MCP. Capillary refill and sensation are intact distally. Motion normal throughout.  No laxity with stress of UCL.     No results found for this or any previous visit (from the past 24 hour(s)). Dg Finger Thumb Left  05/18/2015  CLINICAL DATA:  Crush injury to the left thumb when moving a couch yesterday. Initial encounter. EXAM: LEFT THUMB 2+V COMPARISON:  None. FINDINGS: No evidence of acute fracture or dislocation. Joint spaces well preserved. Well-preserved bone mineral density. No intrinsic osseous abnormalities. IMPRESSION: Normal examination. Electronically Signed   By: Hulan Saashomas  Lawrence M.D.   On: 05/18/2015 [id number]     25 year old male with thumb contusion. Patient was fitted in a well-formed short Exos thumb spica. Treat with NSAIDs. Return as needed.

## 2015-05-18 NOTE — Patient Instructions (Signed)
Thank you for coming in today. Use the splint as needed.  Return if not better in a few weeks.  Take up to 2 aleve twice daily for pain as needed.   Cryotherapy Cryotherapy means treatment with cold. Ice or gel packs can be used to reduce both pain and swelling. Ice is the most helpful within the first 24 to 48 hours after an injury or flare-up from overusing a muscle or joint. Sprains, strains, spasms, burning pain, shooting pain, and aches can all be eased with ice. Ice can also be used when recovering from surgery. Ice is effective, has very few side effects, and is safe for most people to use. PRECAUTIONS  Ice is not a safe treatment option for people with:  Raynaud phenomenon. This is a condition affecting small blood vessels in the extremities. Exposure to cold may cause your problems to return.  Cold hypersensitivity. There are many forms of cold hypersensitivity, including:  Cold urticaria. Red, itchy hives appear on the skin when the tissues begin to warm after being iced.  Cold erythema. This is a red, itchy rash caused by exposure to cold.  Cold hemoglobinuria. Red blood cells break down when the tissues begin to warm after being iced. The hemoglobin that carry oxygen are passed into the urine because they cannot combine with blood proteins fast enough.  Numbness or altered sensitivity in the area being iced. If you have any of the following conditions, do not use ice until you have discussed cryotherapy with your caregiver:  Heart conditions, such as arrhythmia, angina, or chronic heart disease.  High blood pressure.  Healing wounds or open skin in the area being iced.  Current infections.  Rheumatoid arthritis.  Poor circulation.  Diabetes. Ice slows the blood flow in the region it is applied. This is beneficial when trying to stop inflamed tissues from spreading irritating chemicals to surrounding tissues. However, if you expose your skin to cold temperatures for too  long or without the proper protection, you can damage your skin or nerves. Watch for signs of skin damage due to cold. HOME CARE INSTRUCTIONS Follow these tips to use ice and cold packs safely.  Place a dry or damp towel between the ice and skin. A damp towel will cool the skin more quickly, so you may need to shorten the time that the ice is used.  For a more rapid response, add gentle compression to the ice.  Ice for no more than 10 to 20 minutes at a time. The bonier the area you are icing, the less time it will take to get the benefits of ice.  Check your skin after 5 minutes to make sure there are no signs of a poor response to cold or skin damage.  Rest 20 minutes or more between uses.  Once your skin is numb, you can end your treatment. You can test numbness by very lightly touching your skin. The touch should be so light that you do not see the skin dimple from the pressure of your fingertip. When using ice, most people will feel these normal sensations in this order: cold, burning, aching, and numbness.  Do not use ice on someone who cannot communicate their responses to pain, such as small children or people with dementia. HOW TO MAKE AN ICE PACK Ice packs are the most common way to use ice therapy. Other methods include ice massage, ice baths, and cryosprays. Muscle creams that cause a cold, tingly feeling do not offer the  same benefits that ice offers and should not be used as a substitute unless recommended by your caregiver. To make an ice pack, do one of the following:  Place crushed ice or a bag of frozen vegetables in a sealable plastic bag. Squeeze out the excess air. Place this bag inside another plastic bag. Slide the bag into a pillowcase or place a damp towel between your skin and the bag.  Mix 3 parts water with 1 part rubbing alcohol. Freeze the mixture in a sealable plastic bag. When you remove the mixture from the freezer, it will be slushy. Squeeze out the excess  air. Place this bag inside another plastic bag. Slide the bag into a pillowcase or place a damp towel between your skin and the bag. SEEK MEDICAL CARE IF:  You develop white spots on your skin. This may give the skin a blotchy (mottled) appearance.  Your skin turns blue or pale.  Your skin becomes waxy or hard.  Your swelling gets worse. MAKE SURE YOU:   Understand these instructions.  Will watch your condition.  Will get help right away if you are not doing well or get worse.   This information is not intended to replace advice given to you by your health care provider. Make sure you discuss any questions you have with your health care provider.   Document Released: 09/26/2010 Document Revised: 02/20/2014 Document Reviewed: 09/26/2010 Elsevier Interactive Patient Education [employer]

## 2015-05-19 NOTE — Progress Notes (Signed)
Quick Note:  Normal, no changes. ______ 

## 2016-04-12 ENCOUNTER — Emergency Department
Admission: EM | Admit: 2016-04-12 | Discharge: 2016-04-12 | Disposition: A | Discharge status: Home / Self Care | Payer: 59 | Source: Home / Self Care | Attending: Family Medicine | Admitting: Family Medicine

## 2016-04-12 DIAGNOSIS — J069 Acute upper respiratory infection, unspecified: Secondary | ICD-10-CM | POA: Diagnosis not present

## 2016-04-12 DIAGNOSIS — J019 Acute sinusitis, unspecified: Secondary | ICD-10-CM

## 2016-04-12 MED ORDER — IPRATROPIUM BROMIDE 0.06 % NA SOLN: 2.0000 | Freq: Four times a day (QID) | NASAL | 1 refills | Status: DC

## 2016-04-12 MED ORDER — AMOXICILLIN-POT CLAVULANATE 875-125 MG PO TABS: 1.0000 | ORAL_TABLET | Freq: Two times a day (BID) | ORAL | 0 refills | Status: DC

## 2016-04-12 NOTE — ED Triage Notes (Signed)
Started Sat. Morning with head congestion.  Has been blowing out and coughing up greenish mucous.

## 2016-04-12 NOTE — ED Provider Notes (Signed)
CSN: [id number]     Arrival date & time 04/12/16  [id number] History   First MD Initiated Contact with Patient 04/12/16 [phone]     Chief Complaint  Patient presents with  . Cough  . Nasal Congestion  . Headache   (Consider location/radiation/quality/duration/timing/severity/associated sxs/prior Treatment) HPI Mitchell Shaw is a 26 y.o. male presenting to UC with c/o 5 days of gradually worsening sinus congestion with facial pain and pressure and greenish nasal discharge. Mild intermittent productive cough.  His nephew was sick recently but he has not been around him much.  Today, pt's boss suggested he come be evaluated for his illness. He has tried mucinex with minimal relief. Denies fever or chills. Denies nausea or vomiting.     Past Medical History:  Diagnosis Date  . Anxiety    Past Surgical History:  Procedure Laterality Date  . bone graph    . WRIST SURGERY     Family History  Problem Relation Age of Onset  . Hypertension Other   . Rheum arthritis Mother   . Stroke Other   . Hypertension Other   . Rheum arthritis Father   . Rheum arthritis Brother    Social History  Substance Use Topics  . Smoking status: Current Every Day Smoker    Packs/day: 1.00    Years: 6.00    Types: Cigarettes  . Smokeless tobacco: Never Used  . Alcohol use Yes     Comment: 6-8    Review of Systems  Constitutional: Positive for fatigue. Negative for chills and fever.  HENT: Positive for congestion, postnasal drip and rhinorrhea. Negative for ear pain, sore throat, trouble swallowing and voice change.   Respiratory: Positive for cough. Negative for shortness of breath.   Cardiovascular: Negative for chest pain and palpitations.  Gastrointestinal: Negative for abdominal pain, diarrhea, nausea and vomiting.  Musculoskeletal: Negative for arthralgias, back pain and myalgias.  Skin: Negative for rash.  Neurological: Positive for headaches. Negative for dizziness and light-headedness.    Allergies   Patient has no known allergies.  Home Medications   Prior to Admission medications   Medication Sig Start Date End Date Taking? Authorizing Provider  amoxicillin-clavulanate (AUGMENTIN) 875-125 MG tablet Take 1 tablet by mouth 2 (two) times daily. One po bid x 7 days 04/12/16   Junius FinnerErin O'Malley, PA-C  ipratropium (ATROVENT) 0.06 % nasal spray Place 2 sprays into both nostrils 4 (four) times daily. 04/12/16   Junius FinnerErin O'Malley, PA-C   Meds Ordered and Administered this Visit  Medications - No data to display  BP 131/84 (BP Location: Left Arm)   Pulse 104   Temp 97.4 F (36.3 C) (Oral)   Ht 6\' 1"  (1.854 m)   Wt 149 lb (67.6 kg)   SpO2 95%   BMI 19.66 kg/m  No data found.   Physical Exam  Constitutional: He is oriented to person, place, and time. He appears well-developed and well-nourished. No distress.  Pt sitting on exam bed, appears mildly fatigued, acutely ill but non-toxic. NAD. Alert and cooperative during exam.  HENT:  Head: Normocephalic and atraumatic.  Right Ear: Tympanic membrane normal.  Left Ear: Tympanic membrane normal.  Nose: Mucosal edema present. Right sinus exhibits maxillary sinus tenderness and frontal sinus tenderness. Left sinus exhibits maxillary sinus tenderness and frontal sinus tenderness.  Mouth/Throat: Uvula is midline, oropharynx is clear and moist and mucous membranes are normal.  Eyes: EOM are normal.  Neck: Normal range of motion. Neck supple.  Cardiovascular: Normal rate and regular  rhythm.   Pulmonary/Chest: Effort normal. No stridor. No respiratory distress. He has no wheezes. He has no rales.  Musculoskeletal: Normal range of motion.  Lymphadenopathy:    He has no cervical adenopathy.  Neurological: He is alert and oriented to person, place, and time.  Skin: Skin is warm and dry. He is not diaphoretic.  Psychiatric: He has a normal mood and affect. His behavior is normal.  Nursing note and vitals reviewed.   Urgent Care Course      Procedures (including critical care time)  Labs Review Labs Reviewed - No data to display  Imaging Review No results found.    MDM   1. Upper respiratory tract infection, unspecified type   2. Acute rhinosinusitis    Pt c/o 5 days of worsening sinus and URI symptoms.   Sinus tenderness noted on exam.  Rx: Augmented and ipratropium nasal spray  F/u with PCP in 1 week if not improving.    Junius Finner, PA-C 04/12/16 [phone]

## 2017-03-15 ENCOUNTER — Encounter: Payer: Self-pay | Admitting: Family Medicine

## 2017-03-15 ENCOUNTER — Ambulatory Visit: Payer: 59 | Admitting: Family Medicine

## 2017-03-15 VITALS — BP 154/98 | HR 74 | Ht 73.0 in | Wt 158.0 lb

## 2017-03-15 DIAGNOSIS — R030 Elevated blood-pressure reading, without diagnosis of hypertension: Secondary | ICD-10-CM | POA: Diagnosis not present

## 2017-03-15 DIAGNOSIS — Z8659 Personal history of other mental and behavioral disorders: Secondary | ICD-10-CM | POA: Insufficient documentation

## 2017-03-15 DIAGNOSIS — M7581 Other shoulder lesions, right shoulder: Secondary | ICD-10-CM | POA: Diagnosis not present

## 2017-03-15 HISTORY — DX: Personal history of other mental and behavioral disorders: Z86.59

## 2017-03-15 MED ORDER — DICLOFENAC SODIUM 1 % TD GEL: 4.0000 g | Freq: Four times a day (QID) | TRANSDERMAL | 11 refills | Status: DC

## 2017-03-15 NOTE — Patient Instructions (Signed)
Thank you for coming in today. Do home exercises.  Attend PT.  Apply diclofenac gel.  Recheck in 4-6 weeks if not better.  Next step is injection.  If worsening we can see you sooner.    Shoulder Impingement Syndrome Rehab Ask your health care provider which exercises are safe for you. Do exercises exactly as told by your health care provider and adjust them as directed. It is normal to feel mild stretching, pulling, tightness, or discomfort as you do these exercises, but you should stop right away if you feel sudden pain or your pain gets worse.Do not begin these exercises until told by your health care provider. Stretching and range of motion exercise This exercise warms up your muscles and joints and improves the movement and flexibility of your shoulder. This exercise also helps to relieve pain and stiffness. Exercise A: Passive horizontal adduction  1. Sit or stand and pull your left / right elbow across your chest, toward your other shoulder. Stop when you feel a gentle stretch in the back of your shoulder and upper arm. ? Keep your arm at shoulder height. ? Keep your arm as close to your body as you comfortably can. 2. Hold for __________ seconds. 3. Slowly return to the starting position. Repeat __________ times. Complete this exercise __________ times a day. Strengthening exercises These exercises build strength and endurance in your shoulder. Endurance is the ability to use your muscles for a long time, even after they get tired. Exercise B: External rotation, isometric 1. Stand or sit in a doorway, facing the door frame. 2. Bend your left / right elbow and place the back of your wrist against the door frame. Only your wrist should be touching the frame. Keep your upper arm at your side. 3. Gently press your wrist against the door frame, as if you are trying to push your arm away from your abdomen. ? Avoid shrugging your shoulder while you press your hand against the door frame.  Keep your shoulder blade tucked down toward the middle of your back. 4. Hold for __________ seconds. 5. Slowly release the tension, and relax your muscles completely before you do the exercise again. Repeat __________ times. Complete this exercise __________ times a day. Exercise C: Internal rotation, isometric  1. Stand or sit in a doorway, facing the door frame. 2. Bend your left / right elbow and place the inside of your wrist against the door frame. Only your wrist should be touching the frame. Keep your upper arm at your side. 3. Gently press your wrist against the door frame, as if you are trying to push your arm toward your abdomen. ? Avoid shrugging your shoulder while you press your hand against the door frame. Keep your shoulder blade tucked down toward the middle of your back. 4. Hold for __________ seconds. 5. Slowly release the tension, and relax your muscles completely before you do the exercise again. Repeat __________ times. Complete this exercise __________ times a day. Exercise D: Scapular protraction, supine  1. Lie on your back on a firm surface. Hold a __________ weight in your left / right hand. 2. Raise your left / right arm straight into the air so your hand is directly above your shoulder joint. 3. Push the weight into the air so your shoulder lifts off of the surface that you are lying on. Do not move your head, neck, or back. 4. Hold for __________ seconds. 5. Slowly return to the starting position. Let your muscles  relax completely before you repeat this exercise. Repeat __________ times. Complete this exercise __________ times a day. Exercise E: Scapular retraction  1. Sit in a stable chair without armrests, or stand. 2. Secure an exercise band to a stable object in front of you so the band is at shoulder height. 3. Hold one end of the exercise band in each hand. Your palms should face down. 4. Squeeze your shoulder blades together and move your elbows slightly  behind you. Do not shrug your shoulders while you do this. 5. Hold for __________ seconds. 6. Slowly return to the starting position. Repeat __________ times. Complete this exercise __________ times a day. Exercise F: Shoulder extension  1. Sit in a stable chair without armrests, or stand. 2. Secure an exercise band to a stable object in front of you where the band is above shoulder height. 3. Hold one end of the exercise band in each hand. 4. Straighten your elbows and lift your hands up to shoulder height. 5. Squeeze your shoulder blades together and pull your hands down to the sides of your thighs. Stop when your hands are straight down by your sides. Do not let your hands go behind your body. 6. Hold for __________ seconds. 7. Slowly return to the starting position. Repeat __________ times. Complete this exercise __________ times a day. This information is not intended to replace advice given to you by your health care provider. Make sure you discuss any questions you have with your health care provider. Document Released: 01/30/2005 Document Revised: 10/07/2015 Document Reviewed: 01/02/2015 Elsevier Interactive Patient Education  [employer].

## 2017-03-15 NOTE — Progress Notes (Signed)
Mitchell AlarDavid Shaw is a 27 y.o. male who [facility] today for right shoulder pain.  Mitchell Shaw notes about a 4-week history of right shoulder pain.  He developed a pulling sensation in his shoulder when picking up a heavy object.  Since then the pain is continued and worsened.  He notes pain in the lateral upper arm associated with overhead motion and activity.  He also notes pain at night and with reaching back.  He denies any radiating pain weakness or numbness.  He denies any significant clunking or catching.  He is tried some Tylenol but no other significant treatment yet.  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  Additionally Mitchell Shaw would like to establish medical care.  His mother is a primary care patient of mine.  He denies any current medical problems.  He denies any history of hypertension.   Past Medical History:  Diagnosis Date  . Anxiety   . History of anxiety 03/15/2017   Past Surgical History:  Procedure Laterality Date  . bone graph    . WRIST SURGERY     Social History   Tobacco Use  . Smoking status: Current Every Day Smoker    Packs/day: 1.00    Years: 6.00    Pack years: 6.00    Types: Cigarettes  . Smokeless tobacco: Never Used  Substance Use Topics  . Alcohol use: Yes    Comment: 6-8   family history includes Hypertension in his other and other; Rheum arthritis in his brother, father, and mother; Stroke in his other.  ROS:  No headache, visual changes, nausea, vomiting, diarrhea, constipation, dizziness, abdominal pain, skin rash, fevers, chills, night sweats, weight loss, swollen lymph nodes, body aches, joint swelling, muscle aches, chest pain, shortness of breath, mood changes, visual or auditory hallucinations.    Medications: Current Outpatient Medications  Medication Sig Dispense Refill  . diclofenac sodium (VOLTAREN) 1 % GEL Apply 4 g topically 4 (four) times daily. To affected  joint. 100 g 11   No current facility-administered medications for this visit.    No Known Allergies   Exam:  BP (!) 154/98   Pulse 74   Ht 6\' 1"  (1.854 m)   Wt 158 lb (71.7 kg)   BMI 20.85 kg/m   Vitals:   03/15/17 1332 03/15/17 1412  BP: (!) 146/87 (!) 154/98  Pulse: 75 74  , General: Well Developed, well nourished, and in no acute distress.  Neuro/Psych: Alert and oriented x3, extra-ocular muscles intact, able to move all 4 extremities, sensation grossly intact. Skin: Warm and dry, no rashes noted.  Respiratory: Not using accessory muscles, speaking in full sentences, trachea midline.  Cardiovascular: Pulses palpable, no extremity edema. Abdomen: Does not appear distended. MSK:  Right shoulder normal-appearing mildly tender to palpation over the acromioclavicular joint.  Nontender at the bicipital groove. Range of motion: Full external and abduction Internal rotation limited to the lumbar spine compared to thoracic spine left shoulder Strength is intact but painful abduction external and internal rotation Positive Hawkins and Neer's test Positive empty can test Positive O'Brien's test Positive crossover arm compression test Negative Yergason's and speeds tests Negative clunk or relocation test Pulses capillary refill and sensation intact distally    No results found for this or any previous visit (from the past 48 hour(s)). No results found.    Assessment and Plan: 27 y.o. male with  Right shoulder pain: Turning for rotator cuff  strain versus tendinitis.  We discussed options.  Plan for diclofenac gel and home exercises/physical therapy.  If not improving will recheck in the near future and likely proceed with x-ray and injection.  Elevated blood pressure: We will recheck in a few weeks if blood pressure remains elevated will diagnosed with hypertension and complete workup.    Orders Placed This Encounter  Procedures  . Ambulatory referral to Physical  Therapy    Referral Priority:   Routine    Referral Type:   Physical Medicine    Referral Reason:   Specialty Services Required    Requested Specialty:   Physical Therapy   Meds ordered this encounter  Medications  . diclofenac sodium (VOLTAREN) 1 % GEL    Sig: Apply 4 g topically 4 (four) times daily. To affected joint.    Dispense:  100 g    Refill:  11    Discussed warning signs or symptoms. Please see discharge instructions. Patient expresses understanding.

## 2017-04-10 ENCOUNTER — Ambulatory Visit: Payer: 59 | Admitting: Family Medicine

## 2017-05-14 ENCOUNTER — Ambulatory Visit: Payer: 59 | Admitting: Family Medicine

## 2017-05-14 ENCOUNTER — Encounter: Payer: Self-pay | Admitting: Family Medicine

## 2017-05-14 VITALS — BP 147/98 | HR 92 | Ht 73.0 in | Wt 159.0 lb

## 2017-05-14 DIAGNOSIS — L723 Sebaceous cyst: Secondary | ICD-10-CM

## 2017-05-14 DIAGNOSIS — L089 Local infection of the skin and subcutaneous tissue, unspecified: Secondary | ICD-10-CM

## 2017-05-14 MED ORDER — DOXYCYCLINE HYCLATE 100 MG PO TABS: 100.0000 mg | ORAL_TABLET | Freq: Two times a day (BID) | ORAL | 0 refills | Status: DC

## 2017-05-14 MED ORDER — TETANUS-DIPHTH-ACELL PERTUSSIS 5-2-15.5 LF-MCG/0.5 IM SUSP: 0.5000 mL | Freq: Once | INTRAMUSCULAR | 0 refills | Status: AC

## 2017-05-14 NOTE — Progress Notes (Signed)
Mitchell Shaw is a 27 y.o. male who [facility] today for abscess.  Mitchell Shaw notes a enlarging painful bump on his left thoracic back.  He notes a small white bump was been there for a while but is not bothered him.  He notes became red and painful over the last few days.  He denies fevers or chills or injury.   Past Medical History:  Diagnosis Date  . Anxiety   . History of anxiety 03/15/2017   Past Surgical History:  Procedure Laterality Date  . bone graph    . WRIST SURGERY     Social History   Tobacco Use  . Smoking status: Current Every Day Smoker    Packs/day: 1.00    Years: 6.00    Pack years: 6.00    Types: Cigarettes  . Smokeless tobacco: Never Used  Substance Use Topics  . Alcohol use: Yes    Comment: 6-8   family history includes Hypertension in his other and other; Rheum arthritis in his brother, father, and mother; Stroke in his other.  ROS as above:  Medications: Current Outpatient Medications  Medication Sig Dispense Refill  . diclofenac sodium (VOLTAREN) 1 % GEL Apply 4 g topically 4 (four) times daily. To affected joint. 100 g 11  . doxycycline (VIBRA-TABS) 100 MG tablet Take 1 tablet (100 mg total) by mouth 2 (two) times daily. 14 tablet 0  . Tdap (ADACEL) 06-14-13.5 LF-MCG/0.5 injection Inject 0.5 mLs into the muscle once for 1 dose. 0.5 mL 0   No current facility-administered medications for this visit.    No Known Allergies  Health Maintenance Health Maintenance  Topic Date Due  . HIV Screening  11/30/2004  . TETANUS/TDAP  11/30/2008  . INFLUENZA VACCINE  11/15/2017 (Originally 09/13/2017)     Exam:  BP (!) 147/98   Pulse 92   Ht 6\' 1"  (1.854 m)   Wt 159 lb (72.1 kg)   BMI 20.98 kg/m  Gen: Well NAD Skin: Large fluctuant erythematous tender mass left back approximately 3 x 2 cm consistent with infected sebaceous  cyst.  No expressible pus.  Tender to touch.  Abscess incision and drainage. Consent obtained and timeout performed. Skin cleaned with alcohol, and cold spray applied. 5 mL of lidocaine with Afrin injected achieving good anesthesia. Skin was again cleaned with alcohol. A sharp incision was made to the area of fluctuance. Some pus was drained however a large sebaceous cyst was present which was able to be removed using blunt dissection entirely with no remaining cyst wall visible after inspection  Patient tolerated the procedure well. A dressing was applied    No results found for this or any previous visit (from the past 72 hour(s)). No results found.    Assessment and Plan: 27 y.o. male with infected sebaceous cyst.  The cyst wall was visible and removable today.  I do not think there is any remaining cyst wall and I think he will not have a recurrence.  Dressing instructions provided to patient.  Recheck as needed.  I have prescribed a printed backup doxycycline prescription if his pain does not go away with tomorrow.  Additionally he is due for Tdap but not have health insurance.  I have sent a prescription to his pharmacy and he will be able to get it cheaper they are using good Rx coupon.  Follow up for HTN soon.    No orders of  the defined types were placed in this encounter.  Meds ordered this encounter  Medications  . Tdap (ADACEL) 06-14-13.5 LF-MCG/0.5 injection    Sig: Inject 0.5 mLs into the muscle once for 1 dose.    Dispense:  0.5 mL    Refill:  0  . doxycycline (VIBRA-TABS) 100 MG tablet    Sig: Take 1 tablet (100 mg total) by mouth 2 (two) times daily.    Dispense:  14 tablet    Refill:  0     Discussed warning signs or symptoms. Please see discharge instructions. Patient expresses understanding.

## 2017-05-14 NOTE — Patient Instructions (Addendum)
Thank you for coming in today. Recheck if not better.   Get T[facility]ap vaccine at the [facility].  Let me know when you get it.  Take the antibiotics if not getting better rapidly.    Incision and Drainage, Care After Refer to this sheet in the next few weeks. These instructions provide you with information about caring for yourself after your procedure. Your health care provider may also give you more specific instructions. Your treatment has been planned according to current medical practices, but problems sometimes occur. Call your health care provider if you have any problems or questions after your procedure. What can I expect after the procedure? After the procedure, it is common to have:  Pain or discomfort around your incision site.  Drainage from your incision.  Follow these instructions at home:  Take over-the-counter and prescription medicines only as told by your health care provider.  If you were prescribed an antibiotic medicine, take it as told by your health care provider.Do not stop taking the antibiotic even if you start to feel better.  Followinstructions from your health care provider about: ? How to take care of your incision. ? When and how you should change your packing and bandage (dressing). Wash your hands with soap and water before you change your dressing. If soap and water are not available, use hand sanitizer. ? When you should remove your dressing.  Do not take baths, swim, or use a hot tub until your health care provider approves.  Keep all follow-up visits as told by your health care provider. This is important.  Check your incision area every day for signs of infection. Check for: ? More redness, swelling, or pain. ? More fluid or blood. ? Warmth. ? Pus or a bad smell. Contact a health care provider if:  Your cyst or abscess returns.  You have a fever.  You have more redness, swelling, or pain around your incision.  You have more fluid or blood  coming from your incision.  Your incision feels warm to the touch.  You have pus or a bad smell coming from your incision. Get help right away if:  You have severe pain or bleeding.  You cannot eat or drink without vomiting.  You have decreased urine output.  You become short of breath.  You have chest pain.  You cough up blood.  The area where the incision and drainage occurred becomes numb or it tingles. This information is not intended to replace advice given to you by your health care provider. Make sure you discuss any questions you have with your health care provider. Document Released: 04/24/2011 Document Revised: 07/02/2015 Document Reviewed: 11/20/2014 Elsevier Interactive Patient Education  [employer]2018 Elsevier Inc.

## 2017-11-28 ENCOUNTER — Emergency Department
Admission: EM | Admit: 2017-11-28 | Discharge: 2017-11-28 | Disposition: A | Discharge status: Home / Self Care | Payer: 59 | Source: Home / Self Care

## 2017-11-28 ENCOUNTER — Other Ambulatory Visit: Payer: Self-pay

## 2017-11-28 DIAGNOSIS — S91332A Puncture wound without foreign body, left foot, initial encounter: Secondary | ICD-10-CM | POA: Diagnosis not present

## 2017-11-28 DIAGNOSIS — Z23 Encounter for immunization: Secondary | ICD-10-CM

## 2017-11-28 MED ORDER — TETANUS-DIPHTH-ACELL PERTUSSIS 5-2.5-18.5 LF-MCG/0.5 IM SUSP: 0.5000 mL | Freq: Once | INTRAMUSCULAR | Status: DC

## 2017-11-28 MED ORDER — CEPHALEXIN 500 MG PO CAPS: 500.0000 mg | ORAL_CAPSULE | Freq: Two times a day (BID) | ORAL | 0 refills | Status: DC

## 2017-11-28 MED ORDER — MUPIROCIN 2 % EX OINT: 1.0000 "application " | TOPICAL_OINTMENT | Freq: Three times a day (TID) | CUTANEOUS | 1 refills | Status: DC

## 2017-11-28 MED ORDER — TETANUS-DIPHTHERIA TOXOIDS TD 5-2 LFU IM INJ
0.5000 mL | INJECTION | Freq: Once | INTRAMUSCULAR | Status: AC
  Administered 2017-11-28: 0.5 mL via INTRAMUSCULAR

## 2017-11-28 NOTE — ED Provider Notes (Signed)
Mitchell [facility]    CSN: [id number] Arrival date & time: 11/28/17  1729     History   Chief Complaint Chief Complaint  Patient presents with  . Foot Injury    HPI Mitchell Shaw is a 27 y.o. male.   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  He wash the area thoroughly with peroxide but came in for tetanus shot and advice.  There is no active bleeding, no allergy to antibiotics, and no serious pain right now.     Past Medical History:  Diagnosis Date  . Anxiety   . History of anxiety 03/15/2017    Patient Active Problem List   Diagnosis Date Noted  . History of anxiety 03/15/2017  . Elevated blood pressure reading 03/15/2017  . Rotator cuff tendonitis, right 03/15/2017    Past Surgical History:  Procedure Laterality Date  . bone graph    . WRIST SURGERY         Home Medications    Prior to Admission medications   Medication Sig Start Date End Date Taking? Authorizing Provider  cephALEXin (KEFLEX) 500 MG capsule Take 1 capsule (500 mg total) by mouth 2 (two) times daily. 11/28/17   Elvina Sidle, MD  mupirocin ointment (BACTROBAN) 2 % Apply 1 application topically 3 (three) times daily. 11/28/17   Elvina Sidle, MD    Family History Family History  Problem Relation Age of Onset  . Hypertension Other   . Rheum arthritis Mother   . Stroke Other   . Hypertension Other   . Rheum arthritis Father   . Rheum arthritis Brother     Social History Social History   Tobacco Use  . Smoking status: Current Every Day Smoker    Packs/day: 1.00    Years: 6.00    Pack years: 6.00    Types: Cigarettes  . Smokeless tobacco: Never Used  Substance Use Topics  . Alcohol use: Yes    Comment: 6-8  . Drug use: No     Allergies   Patient has no known allergies.   Review of Systems Review of Systems   Physical Exam Triage Vital Signs ED Triage Vitals [11/28/17 1743]  Enc Vitals Group   BP (!) 147/94     Pulse Rate 80     Resp      Temp      Temp src      SpO2 98 %     Weight      Height      Head Circumference      Peak Flow      Pain Score 4     Pain Loc      Pain Edu?      Excl. in GC?    No data found.  Updated Vital Signs BP (!) 147/94 (BP Location: Left Arm)   Pulse 80   SpO2 98%   Physical Exam  Constitutional: He appears well-developed and well-nourished.  HENT:  Right Ear: External ear normal.  Left Ear: External ear normal.  Eyes: Conjunctivae are normal.  Neck: Normal range of motion. Neck supple.  Pulmonary/Chest: Effort normal.  Musculoskeletal: Normal range of motion.  Neurological: He is alert.  Skin: Skin is warm and dry.  Psychiatric: He has a normal mood and affect. His behavior is normal. Judgment normal.  Nursing note and vitals reviewed.      UC Treatments / Results  Labs (all labs ordered are listed,  but only abnormal results are displayed) Labs Reviewed - No data to display  EKG None  Radiology No results found.  Procedures Procedures (including critical care time)  Medications Ordered in UC Medications  tetanus & diphtheria toxoids (adult) (TENIVAC) injection 0.5 mL (has no administration in time range)    Initial Impression / Assessment and Plan / UC Course  I have reviewed the triage vital signs and the nursing notes.  Pertinent labs & imaging results that were available during my care of the patient were reviewed by me and considered in my medical decision making (see chart for details).    Final Clinical Impressions(s) / UC Diagnoses   Final diagnoses:  Puncture wound of left foot, initial encounter     Discharge Instructions     Continue to wash the puncture site with soap and water twice daily    ED Prescriptions    Medication Sig Dispense Auth. Provider   cephALEXin (KEFLEX) 500 MG capsule Take 1 capsule (500 mg total) by mouth 2 (two) times daily. 10 capsule Elvina Sidle, MD    mupirocin ointment (BACTROBAN) 2 % Apply 1 application topically 3 (three) times daily. 22 g Elvina Sidle, MD     Controlled Substance Prescriptions [place] Controlled Substance Registry consulted? Not Applicable   Elvina Sidle, MD 11/28/17 [phone]

## 2017-11-28 NOTE — ED Triage Notes (Signed)
Stepped on nail about an hr ago. Desires to have tdap updated.

## 2017-11-28 NOTE — Discharge Instructions (Addendum)
Continue to wash the puncture site with soap and water twice daily

## 2018-12-17 ENCOUNTER — Ambulatory Visit (INDEPENDENT_AMBULATORY_CARE_PROVIDER_SITE_OTHER): Payer: 59

## 2018-12-17 ENCOUNTER — Encounter: Payer: Self-pay | Admitting: Sports Medicine

## 2018-12-17 ENCOUNTER — Other Ambulatory Visit: Payer: Self-pay

## 2018-12-17 ENCOUNTER — Ambulatory Visit (INDEPENDENT_AMBULATORY_CARE_PROVIDER_SITE_OTHER): Payer: 59 | Admitting: Sports Medicine

## 2018-12-17 DIAGNOSIS — R2231 Localized swelling, mass and lump, right upper limb: Secondary | ICD-10-CM

## 2018-12-17 NOTE — Progress Notes (Signed)
Subjective:    CC: Mass on R middle finger  HPI: Mitchell Shaw is a pleasant 28 year old who presents today with a 1x1 cm mass on the palmar surface of his R middle finger over the distal phalanx. The mass has persisted for ~1 year developing over a months time and remaining stable. Over the past month the mass has become painful to the touch. He endorses numbness and tingling at the finger tip. His work involves handling of wood and Runner, broadcasting/film/video however, he reports no known foreign bodies.  I reviewed the past medical history, family history, social history, surgical history, and allergies today and no changes were needed.  Please see the problem list section below in epic for further details.  Past Medical History: Past Medical History:  Diagnosis Date  . Anxiety   . History of anxiety 03/15/2017   Past Surgical History: Past Surgical History:  Procedure Laterality Date  . bone graph    . WRIST SURGERY     Social History: Social History   Socioeconomic History  . Marital status: Single    Spouse name: Not on file  . Number of children: Not on file  . Years of education: Not on file  . Highest education level: Not on file  Occupational History  . Not on file  Social Needs  . Financial resource strain: Not on file  . Food insecurity    Worry: Not on file    Inability: Not on file  . Transportation needs    Medical: Not on file    Non-medical: Not on file  Tobacco Use  . Smoking status: Current Every Day Smoker    Packs/day: 1.00    Years: 6.00    Pack years: 6.00    Types: Cigarettes  . Smokeless tobacco: Never Used  Substance and Sexual Activity  . Alcohol use: Yes    Comment: 6-8  . Drug use: No  . Sexual activity: Not on file  Lifestyle  . Physical activity    Days per week: Not on file    Minutes per session: Not on file  . Stress: Not on file  Relationships  . Social Herbalist on phone: Not on file    Gets together: Not on file    Attends religious  service: Not on file    Active member of club or organization: Not on file    Attends meetings of clubs or organizations: Not on file    Relationship status: Not on file  Other Topics Concern  . Not on file  Social History Narrative  . Not on file   Family History: Family History  Problem Relation Age of Onset  . Hypertension Other   . Rheum arthritis Mother   . Stroke Other   . Hypertension Other   . Rheum arthritis Father   . Rheum arthritis Brother    Allergies: No Known Allergies Medications: See med rec.  Review of Systems: No fevers, chills, night sweats, weight loss, chest pain, or shortness of breath.   Objective:    General: Well Developed, well nourished, and in no acute distress.  Neuro: Alert and oriented x3, extra-ocular muscles intact, sensation grossly intact.  HEENT: Normocephalic, atraumatic, pupils equal round reactive to light.  Skin: Warm and dry, no rashes. Cardiac: Regular rate and rhythm,no lower extremity edema.  Respiratory: Not using accessory muscles, speaking in full sentences. Hand: 1x1 cm mass on the palmar surface of his R middle finger over the distal  phalanx. The mass is soft and mobile. Mild tenderness to palpation.  A/P: Mitchell Shaw has had a mass on his R middle finger for one year which has recently become painful with numbness and tingling distal to the mass. The mass is soft, mobile and non-purulent. Mild tenderness over mass. Differential for this presentation includes foreign body, lipoma, or schwannoma of the finger. MRI with contrast has been scheduled to evaluate and decision to excise in office vs referral to orthopedics will be made.  Impression and Recommendations:    Mass of the right middle finger With distal paresthesias, question nerve sheath tumor versus soft tissue tumor. Before we go any further we are going to proceed with x-rays and an MRI of the finger with IV contrast.   ___________________________________________ Ihor Austin. Benjamin Stain, M.D., ABFM., CAQSM. Primary Care and Sports Medicine [facility]  Adjunct Professor of Family Medicine  [employer]

## 2018-12-17 NOTE — Assessment & Plan Note (Signed)
With distal paresthesias, question nerve sheath tumor versus soft tissue tumor. Before we go any further we are going to proceed with x-rays and an MRI of the finger with IV contrast.

## 2018-12-30 ENCOUNTER — Ambulatory Visit (INDEPENDENT_AMBULATORY_CARE_PROVIDER_SITE_OTHER): Payer: 59

## 2018-12-30 ENCOUNTER — Other Ambulatory Visit: Payer: Self-pay

## 2018-12-30 DIAGNOSIS — R2231 Localized swelling, mass and lump, right upper limb: Secondary | ICD-10-CM | POA: Diagnosis not present

## 2018-12-30 MED ORDER — GADOBUTROL 1 MMOL/ML IV SOLN
7.5000 mL | Freq: Once | INTRAVENOUS | Status: AC | PRN
  Administered 2018-12-30: 7.5 mL via INTRAVENOUS

## 2019-01-02 ENCOUNTER — Other Ambulatory Visit: Payer: Self-pay

## 2019-01-02 ENCOUNTER — Ambulatory Visit (INDEPENDENT_AMBULATORY_CARE_PROVIDER_SITE_OTHER): Payer: 59 | Admitting: Sports Medicine

## 2019-01-02 ENCOUNTER — Encounter: Payer: Self-pay | Admitting: Sports Medicine

## 2019-01-02 DIAGNOSIS — R2231 Localized swelling, mass and lump, right upper limb: Secondary | ICD-10-CM | POA: Diagnosis not present

## 2019-01-02 MED ORDER — NAPROXEN 500 MG PO TABS: 500.0000 mg | ORAL_TABLET | Freq: Two times a day (BID) | ORAL | 3 refills | Status: AC

## 2019-01-02 MED ORDER — TRAMADOL HCL 50 MG PO TABS: 50.0000 mg | ORAL_TABLET | Freq: Three times a day (TID) | ORAL | 0 refills | Status: DC | PRN

## 2019-01-02 NOTE — Assessment & Plan Note (Signed)
Surgical excision, closed with interrupted sutures. Return in 1 week, tramadol as well as Aleve for postoperative pain. This appeared to be a simple sebaceous cyst.

## 2019-01-02 NOTE — Progress Notes (Addendum)
   Procedure:  Excision of 3 cm tumor from the right middle fingertip Risks, benefits, and alternatives explained and consent obtained. Time out conducted. Surface prepped with alcohol. 10 cc lidocaine used in a digital block Adequate anesthesia ensured. Area prepped and draped in a sterile fashion. Excision performed with: Using a #15 blade I made a linear incision in the pad of the finger away from the location of the neurovascular bundle, I then carried the dissection down bluntly until I encountered a glistening capsule, I dissected around the capsule removing it en bloc, I then closed the incision with a simple interrupted pattern of three 3-0 Ethilon sutures. Hemostasis achieved. Pt stable.

## 2019-01-08 ENCOUNTER — Other Ambulatory Visit: Payer: Self-pay

## 2019-01-08 ENCOUNTER — Ambulatory Visit (INDEPENDENT_AMBULATORY_CARE_PROVIDER_SITE_OTHER): Payer: 59 | Admitting: Sports Medicine

## 2019-01-08 DIAGNOSIS — R2231 Localized swelling, mass and lump, right upper limb: Secondary | ICD-10-CM

## 2019-01-08 NOTE — Assessment & Plan Note (Addendum)
1 week post surgical excision of a fingertip sebaceous cyst with care to avoid the neurovascular bundle, doing well. Sutures removed, skin glue applied, return in 1 month for a final check.

## 2019-01-08 NOTE — Progress Notes (Signed)
  Subjective: 1 week post surgical excision, doing extremely well.  Objective: General: Well-developed, well-nourished, and in no acute distress. Right middle finger: I removed 3 sutures, incision is clean, dry, intact, I applied Dermabond to protect the wound, there was still a bit of swelling, he did have some paresthesias at the fingertip but overall sensation was grossly intact.        Assessment/plan:   Mass of the right middle finger 1 week post surgical excision of a fingertip sebaceous cyst with care to avoid the neurovascular bundle, doing well. Sutures removed, skin glue applied, return in 1 month for a final check.    ___________________________________________ Gwen Her. Dianah Field, M.D., ABFM., CAQSM. Primary Care and Sports Medicine [facility]  Adjunct Professor of [employer]

## 2019-02-10 ENCOUNTER — Ambulatory Visit: Payer: 59 | Admitting: Sports Medicine

## 2020-03-23 ENCOUNTER — Encounter: Payer: Self-pay | Admitting: Family Medicine

## 2020-03-23 ENCOUNTER — Telehealth (INDEPENDENT_AMBULATORY_CARE_PROVIDER_SITE_OTHER): Payer: BC Managed Care – PPO | Admitting: Family Medicine

## 2020-03-23 DIAGNOSIS — R112 Nausea with vomiting, unspecified: Secondary | ICD-10-CM

## 2020-03-23 MED ORDER — ONDANSETRON 4 MG PO TBDP: 4.0000 mg | ORAL_TABLET | Freq: Three times a day (TID) | ORAL | 0 refills | Status: DC | PRN

## 2020-03-23 NOTE — Progress Notes (Signed)
Symptoms started 03/23/20  Sx: dry heaving, dark brown emesis, chest pressure

## 2020-03-23 NOTE — Assessment & Plan Note (Signed)
Nausea and vomiting does not sound to be bloody in nature.  No diarrhea associated with this.  Possible COVID as he is having some chest tighntess as well as feeling like he can't take deep breath.   Additional ddx include other viral illness, GERD, biliary disease.  I think boerhaave is less likely as pain he is having does not seem severe at this time.  Discussed that if pain worsens or he develops fever, bloody emesis he should be seen in the ED.

## 2020-03-23 NOTE — Progress Notes (Signed)
Mitchell Shaw - 30 y.o. male MRN [id number]  Date of birth: [birth year]   This visit type was conducted due to national recommendations for restrictions regarding the COVID-19 Pandemic (e.g. social distancing).  This format is felt to be most appropriate for this patient at this time.  All issues noted in this document were discussed and addressed.  No physical exam was performed (except for noted visual exam findings with Video Visits).  I discussed the limitations of evaluation and management by telemedicine and the availability of in person appointments. The patient expressed understanding and agreed to proceed.  I connected with@ on 03/23/20 at 10:50 AM EST by a video enabled telemedicine application and verified that I am speaking with the correct person using two identifiers.  Present at visit: Everrett Coombe, DO Marikay Alar   Patient Location: Home [address] [place] [address]   Provider location:   [facility]  Chief Complaint  Patient presents with  . Cough    HPI  Mitchell Shaw is a 30 y.o. male who presents via audio/video conferencing for a telehealth visit today.  Reports that he woke up this morning and had episode of vomiting.  Vomit was dark in color without visible blood.  Denies coffee ground appearance.  Had dry heaves after that. Continues to have some nausea.  He is drinking plenty of fluids.  He has also had some pain on both sides of his chest and feels like he can't get a deep breath at times.  He has not checked O2 sats.  He denies fever, chills, diarrhea, body aches or headache.    ROS:  A comprehensive ROS was completed and negative except as noted per HPI    ROS:  A comprehensive ROS was completed and negative except as noted per HPI  Past Medical History:  Diagnosis Date  . Anxiety   . History of anxiety 03/15/2017    Past Surgical History:  Procedure Laterality Date  . bone graph    . WRIST SURGERY      Family History  Problem Relation Age of  Onset  . Hypertension Other   . Rheum arthritis Mother   . Stroke Other   . Hypertension Other   . Rheum arthritis Father   . Rheum arthritis Brother     Social History   Socioeconomic History  . Marital status: Single    Spouse name: Not on file  . Number of children: Not on file  . Years of education: Not on file  . Highest education level: Not on file  Occupational History  . Not on file  Tobacco Use  . Smoking status: Current Every Day Smoker    Packs/day: 1.00    Years: 6.00    Pack years: 6.00    Types: Cigarettes  . Smokeless tobacco: Never Used  Vaping Use  . Vaping Use: Never used  Substance and Sexual Activity  . Alcohol use: Yes    Comment: 6-8  . Drug use: No  . Sexual activity: Not on file  Other Topics Concern  . Not on file  Social History Narrative  . Not on file   Social Determinants of Health   Financial Resource Strain: Not on file  Food Insecurity: Not on file  Transportation Needs: Not on file  Physical Activity: Not on file  Stress: Not on file  Social Connections: Not on file  Intimate Partner Violence: Not on file     Current Outpatient Medications:  .  ondansetron (ZOFRAN ODT) 4 MG disintegrating tablet, Take 1 tablet (4 mg total) by mouth every 8 (eight) hours as needed for nausea or vomiting., Disp: 20 tablet, Rfl: 0  EXAM:  VITALS per patient if applicable: Temp 99.2 F (37.3 C) (Oral)   Wt 180 lb (81.6 kg)   BMI 23.75 kg/m   GENERAL: alert, oriented, appears well and in no acute distress  HEENT: atraumatic, conjunttiva clear, no obvious abnormalities on inspection of external nose and ears  NECK: normal movements of the head and neck  LUNGS: on inspection no signs of respiratory distress, breathing rate appears normal, no obvious gross SOB, gasping or wheezing  CV: no obvious cyanosis  MS: moves all visible extremities without noticeable abnormality  PSYCH/NEURO: pleasant and cooperative, no obvious depression or  anxiety, speech and thought processing grossly intact  ASSESSMENT AND PLAN:  Discussed the following assessment and plan:  Nausea and vomiting Nausea and vomiting does not sound to be bloody in nature.  No diarrhea associated with this.  Possible COVID as he is having some chest tighntess as well as feeling like he can't take deep breath.   Additional ddx include other viral illness, GERD, biliary disease.  I think boerhaave is less likely as pain he is having does not seem severe at this time.  Discussed that if pain worsens or he develops fever, bloody emesis he should be seen in the ED.       Meds ordered this encounter  Medications  . ondansetron (ZOFRAN ODT) 4 MG disintegrating tablet    Sig: Take 1 tablet (4 mg total) by mouth every 8 (eight) hours as needed for nausea or vomiting.    Dispense:  20 tablet    Refill:  0    I discussed the assessment and treatment plan with the patient. The patient was provided an opportunity to ask questions and all were answered. The patient agreed with the plan and demonstrated an understanding of the instructions.   The patient was advised to call back or seek an in-person evaluation if the symptoms worsen or if the condition fails to improve as anticipated.    Everrett Coombe, DO

## 2020-07-08 ENCOUNTER — Ambulatory Visit: Payer: BC Managed Care – PPO | Admitting: Sports Medicine

## 2020-07-08 ENCOUNTER — Other Ambulatory Visit: Payer: Self-pay

## 2020-07-08 ENCOUNTER — Ambulatory Visit (INDEPENDENT_AMBULATORY_CARE_PROVIDER_SITE_OTHER): Payer: BC Managed Care – PPO

## 2020-07-08 DIAGNOSIS — S6991XA Unspecified injury of right wrist, hand and finger(s), initial encounter: Secondary | ICD-10-CM

## 2020-07-08 MED ORDER — MELOXICAM 15 MG PO TABS: ORAL_TABLET | ORAL | 3 refills | Status: DC

## 2020-07-08 NOTE — Progress Notes (Signed)
    Procedures performed today:    None.  Independent interpretation of notes and tests performed by another provider:   None.  Brief History, Exam, Impression, and Recommendations:    Injury of hand, right 4 weeks post punching an object, he did find out that he lost a family member and became angry. He did have some deformity, today he has pain at the tip of the third metacarpal as well as at the base of the third proximal phalanx with without obvious rotational deformity. He is able to make a full fist and all fingers point appropriately at the thenar eminence without scissoring. This far out we are just going to buddy tape his third and fourth fingers, adding meloxicam, x-rays, return to see me in a month.    ___________________________________________ Ihor Austin. Benjamin Stain, M.D., ABFM., CAQSM. Primary Care and Sports Medicine [facility]  Adjunct Instructor of Family Medicine  [employer]

## 2020-07-08 NOTE — Assessment & Plan Note (Signed)
4 weeks post punching an object, he did find out that he lost a family member and became angry. He did have some deformity, today he has pain at the tip of the third metacarpal as well as at the base of the third proximal phalanx with without obvious rotational deformity. He is able to make a full fist and all fingers point appropriately at the thenar eminence without scissoring. This far out we are just going to buddy tape his third and fourth fingers, adding meloxicam, x-rays, return to see me in a month.

## 2020-08-06 ENCOUNTER — Ambulatory Visit: Payer: BC Managed Care – PPO | Admitting: Sports Medicine

## 2021-03-22 ENCOUNTER — Other Ambulatory Visit: Payer: Self-pay

## 2021-03-22 ENCOUNTER — Ambulatory Visit: Payer: BC Managed Care – PPO | Admitting: Sports Medicine

## 2021-03-22 DIAGNOSIS — R739 Hyperglycemia, unspecified: Secondary | ICD-10-CM

## 2021-03-22 DIAGNOSIS — N529 Male erectile dysfunction, unspecified: Secondary | ICD-10-CM | POA: Diagnosis not present

## 2021-03-22 DIAGNOSIS — M722 Plantar fascial fibromatosis: Secondary | ICD-10-CM

## 2021-03-22 MED ORDER — MELOXICAM 15 MG PO TABS: ORAL_TABLET | ORAL | 3 refills | Status: DC

## 2021-03-22 MED ORDER — TADALAFIL 5 MG PO TABS: 5.0000 mg | ORAL_TABLET | Freq: Every day | ORAL | 3 refills | Status: DC | PRN

## 2021-03-22 MED ORDER — TRAMADOL HCL 50 MG PO TABS: 50.0000 mg | ORAL_TABLET | Freq: Three times a day (TID) | ORAL | 0 refills | Status: DC | PRN

## 2021-03-22 NOTE — Assessment & Plan Note (Signed)
Unclear etiology, difficulty with initiation, duration, quality of erections. Things are little better when he does not himself. We will do an initial work-up including CBC, CMP, TSH, testosterone, A1c. Adding low-dose Cialis to hold him over in the meantime.

## 2021-03-22 NOTE — Assessment & Plan Note (Signed)
Several weeks increasing pain plantar right foot, severe pain in the morning, exam consistent with plantar fasciitis, he will avoid barefoot walking, adding a referral for custom orthotics, meloxicam, tramadol, seated duty for the next week. Home exercises given. Return to see me in a month, injection if no better.

## 2021-03-22 NOTE — Progress Notes (Signed)
° ° °  Procedures performed today:    None.  Independent interpretation of notes and tests performed by another provider:   None.  Brief History, Exam, Impression, and Recommendations:    Erectile dysfunction Unclear etiology, difficulty with initiation, duration, quality of erections. Things are little better when he does not himself. We will do an initial work-up including CBC, CMP, TSH, testosterone, A1c. Adding low-dose Cialis to hold him over in the meantime.  Plantar fasciitis, right Several weeks increasing pain plantar right foot, severe pain in the morning, exam consistent with plantar fasciitis, he will avoid barefoot walking, adding a referral for custom orthotics, meloxicam, tramadol, seated duty for the next week. Home exercises given. Return to see me in a month, injection if no better.    ___________________________________________ Gwen Her. Dianah Field, M.D., ABFM., CAQSM. Primary Care and [place] Instructor of [employer] [facility] [employer] Medicine

## 2021-03-26 LAB — CBC
HCT: 49.5 % (ref 38.5–50.0)
Hemoglobin: 17.1 g/dL (ref 13.2–17.1)
MCH: 33.4 pg — ABNORMAL HIGH (ref 27.0–33.0)
MCHC: 34.5 g/dL (ref 32.0–36.0)
MCV: 96.7 fL (ref 80.0–100.0)
MPV: 10.4 fL (ref 7.5–12.5)
Platelets: 314 10*3/uL (ref 140–400)
RBC: 5.12 10*6/uL (ref 4.20–5.80)
RDW: 13.1 % (ref 11.0–15.0)
WBC: 5.3 10*3/uL (ref 3.8–10.8)

## 2021-03-26 LAB — COMPREHENSIVE METABOLIC PANEL
AG Ratio: 2 (calc) (ref 1.0–2.5)
ALT: 24 U/L (ref 9–46)
AST: 30 U/L (ref 10–40)
Albumin: 4.7 g/dL (ref 3.6–5.1)
Alkaline phosphatase (APISO): 76 U/L (ref 36–130)
BUN/Creatinine Ratio: 5 (calc) — ABNORMAL LOW (ref 6–22)
BUN: 4 mg/dL — ABNORMAL LOW (ref 7–25)
CO2: 26 mmol/L (ref 20–32)
Calcium: 9.6 mg/dL (ref 8.6–10.3)
Chloride: 103 mmol/L (ref 98–110)
Creat: 0.8 mg/dL (ref 0.60–1.26)
Globulin: 2.4 g/dL (calc) (ref 1.9–3.7)
Glucose, Bld: 95 mg/dL (ref 65–99)
Potassium: 4.2 mmol/L (ref 3.5–5.3)
Sodium: 141 mmol/L (ref 135–146)
Total Bilirubin: 0.5 mg/dL (ref 0.2–1.2)
Total Protein: 7.1 g/dL (ref 6.1–8.1)

## 2021-03-26 LAB — HEMOGLOBIN A1C
Hgb A1c MFr Bld: 5.1 %{Hb}
Mean Plasma Glucose: 100 mg/dL
eAG (mmol/L): 5.5 mmol/L

## 2021-03-26 LAB — TESTOSTERONE, FREE & TOTAL
Free Testosterone: 209.5 pg/mL — ABNORMAL HIGH (ref 35.0–155.0)
Testosterone, Total, LC-MS-MS: 646 ng/dL (ref 250–1100)

## 2021-03-26 LAB — TSH: TSH: 2.17 m[IU]/L (ref 0.40–4.50)

## 2021-03-30 ENCOUNTER — Encounter: Payer: BC Managed Care – PPO | Admitting: Family Medicine

## 2021-03-31 ENCOUNTER — Ambulatory Visit: Payer: BC Managed Care – PPO | Admitting: Family Medicine

## 2021-03-31 ENCOUNTER — Encounter: Payer: Self-pay | Admitting: Family Medicine

## 2021-03-31 DIAGNOSIS — M722 Plantar fascial fibromatosis: Secondary | ICD-10-CM

## 2021-03-31 NOTE — Progress Notes (Signed)
°  [name] - 31 y.o. male MRN [id number]  Date of birth: 03/14/1990  SUBJECTIVE:  Including CC & ROS.  No chief complaint on file.   Mitchell Shaw is a 31 y.o. male that is presenting with right foot pain.  The pain is acutely occurring.  He is having the pain worse in the morning and with the first few steps.  Only occurring on the right side.  Review of the note from 2/7 shows he was provided meloxicam at tramadol and work note.   Review of Systems See HPI   HISTORY: Past Medical, Surgical, Social, and Family History Reviewed & Updated per EMR.   Pertinent Historical Findings include:  Past Medical History:  Diagnosis Date   Anxiety    History of anxiety 03/15/2017    Past Surgical History:  Procedure Laterality Date   bone graph     WRIST SURGERY       PHYSICAL EXAM:  VS: Ht 6\' 1"  (1.854 m)    Wt 180 lb (81.6 kg)    BMI 23.75 kg/m  Physical Exam Gen: NAD, alert, cooperative with exam, well-appearing MSK:  Neurovascularly intact    Patient was fitted for a standard, cushioned, semi-rigid orthotic. The orthotic was heated and afterward the patient stood on the orthotic blank positioned on the orthotic stand. The patient was positioned in subtalar neutral position and 10 degrees of ankle dorsiflexion in a weight bearing stance. After completion of molding, a stable base was applied to the orthotic blank. The blank was ground to a stable position for weight bearing. Size: 12 Pairs: 2 Base: Blue EVA Additional Posting and Padding: None The patient ambulated these, and they were very comfortable.     ASSESSMENT & PLAN:   Plantar fasciitis, right Acutely occurring.  He has some translation medially of the midfoot. -Counseled on home exercise therapy and supportive care. -Orthotics. -May need to add scaphoid pads.

## 2021-03-31 NOTE — Assessment & Plan Note (Signed)
Acutely occurring.  He has some translation medially of the midfoot. -Counseled on home exercise therapy and supportive care. -Orthotics. -May need to add scaphoid pads.

## 2021-04-19 ENCOUNTER — Ambulatory Visit: Payer: BC Managed Care – PPO | Admitting: Sports Medicine

## 2022-05-09 NOTE — Progress Notes (Unsigned)
     Established patient visit   Patient: Mitchell Shaw   DOB: 1990-04-05   32 y.o. Male  MRN: [id number] Visit Date: 05/10/2022  Today's healthcare provider: Owens Loffler, DO   No chief complaint on file.   SUBJECTIVE   No chief complaint on file.  HPI    Review of Systems     No outpatient medications have been marked as taking for the 05/10/22 encounter (Appointment) with Owens Loffler, DO.    OBJECTIVE    There were no vitals taken for this visit.  Physical Exam       ASSESSMENT & PLAN    Problem List Items Addressed This Visit   None   No follow-ups on file.      No orders of the defined types were placed in this encounter.   No orders of the defined types were placed in this encounter.    Owens Loffler, DO  [facility] [phone] (phone) [phone] (fax)  [place]

## 2022-05-10 ENCOUNTER — Ambulatory Visit: Payer: BC Managed Care – PPO | Admitting: Family Medicine

## 2022-05-10 ENCOUNTER — Ambulatory Visit (INDEPENDENT_AMBULATORY_CARE_PROVIDER_SITE_OTHER): Payer: BC Managed Care – PPO

## 2022-05-10 ENCOUNTER — Encounter: Payer: Self-pay | Admitting: Family Medicine

## 2022-05-10 VITALS — BP 160/103 | HR 107 | Ht 73.0 in | Wt 179.0 lb

## 2022-05-10 DIAGNOSIS — M62838 Other muscle spasm: Secondary | ICD-10-CM

## 2022-05-10 DIAGNOSIS — R030 Elevated blood-pressure reading, without diagnosis of hypertension: Secondary | ICD-10-CM

## 2022-05-10 DIAGNOSIS — M545 Low back pain, unspecified: Secondary | ICD-10-CM

## 2022-05-10 DIAGNOSIS — M549 Dorsalgia, unspecified: Secondary | ICD-10-CM | POA: Diagnosis not present

## 2022-05-10 MED ORDER — CYCLOBENZAPRINE HCL 5 MG PO TABS: 5.0000 mg | ORAL_TABLET | Freq: Three times a day (TID) | ORAL | 0 refills | Status: DC | PRN

## 2022-05-10 NOTE — Patient Instructions (Signed)
Muscle Spasms Treatment:  -salon pas patches  - ibuprofen 600mg  Q8hrs prn  - try back and shoulder stretches  - if you try a massage then take an epsom salt bath

## 2022-05-10 NOTE — Assessment & Plan Note (Addendum)
-  Muscle spasm located on left shoulder blade.  Tenderness to palpation of left shoulder blade -salon pas patches  - ibuprofen 600mg  Q8hrs prn  -Gone ahead and given patient Flexeril to take at night.  Did tell him that he is not allowed to drive on this medication and it is best if he takes it at night before bed. - try back and shoulder stretches  - if you try a massage then take an epsom salt bath -given shoulder stretches as well -If no better with stretches, can consider physical therapy.

## 2022-05-10 NOTE — Assessment & Plan Note (Signed)
-  Blood pressure elevated today at 160/103 which is likely due to the amount of pain the patient is in.  Will go ahead and recheck.

## 2022-05-10 NOTE — Assessment & Plan Note (Signed)
-  32 year old male presents for low back pain that has been present for about 2 months.  Back pain is acute and waking him up at night which makes me want to order an x-ray to make sure this is not a bone problem.

## 2022-05-18 IMAGING — DX DG FINGER MIDDLE 2+V*R*
3 series · 3 of 3 positions shown · non-contrast
Comparison: None.

CLINICAL DATA: Status post trauma 4 weeks ago.

EXAM:
RIGHT MIDDLE FINGER 2+V

[finger ap]
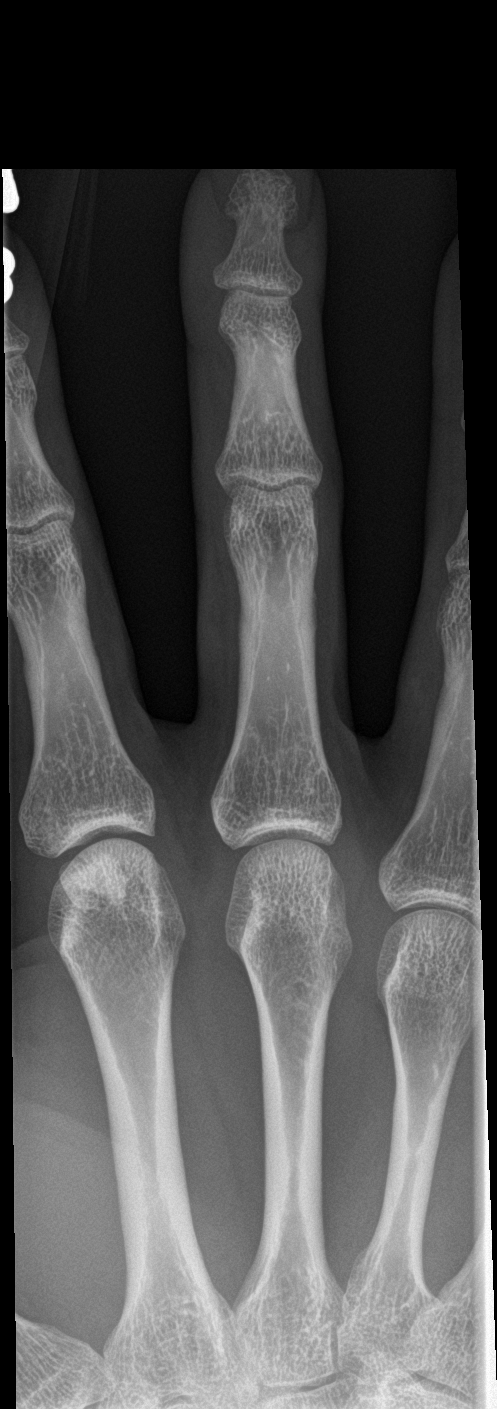

[finger obl]
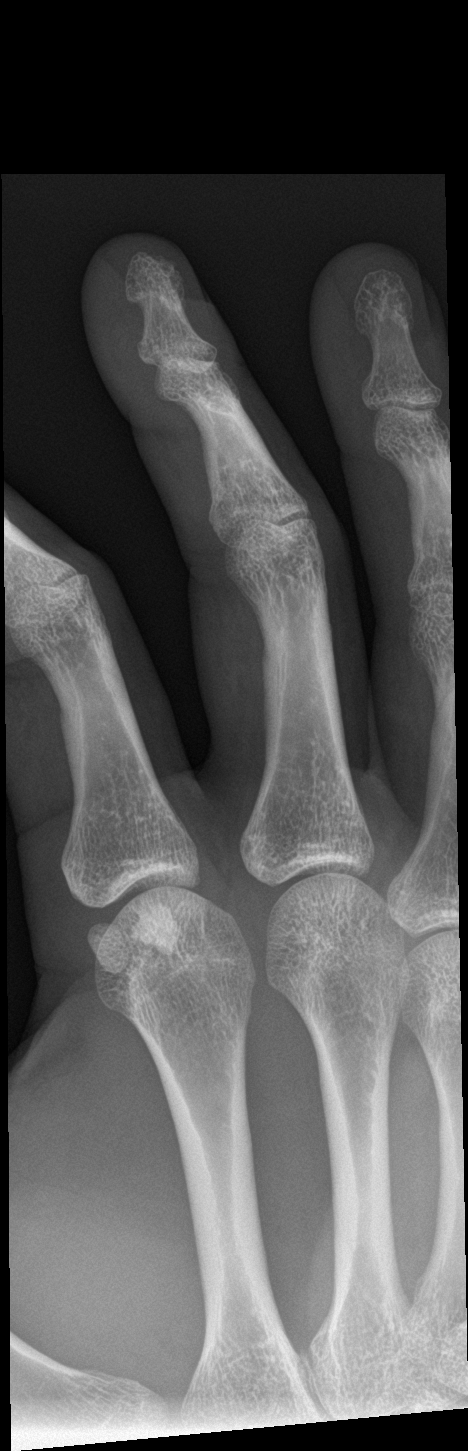

[finger lat]
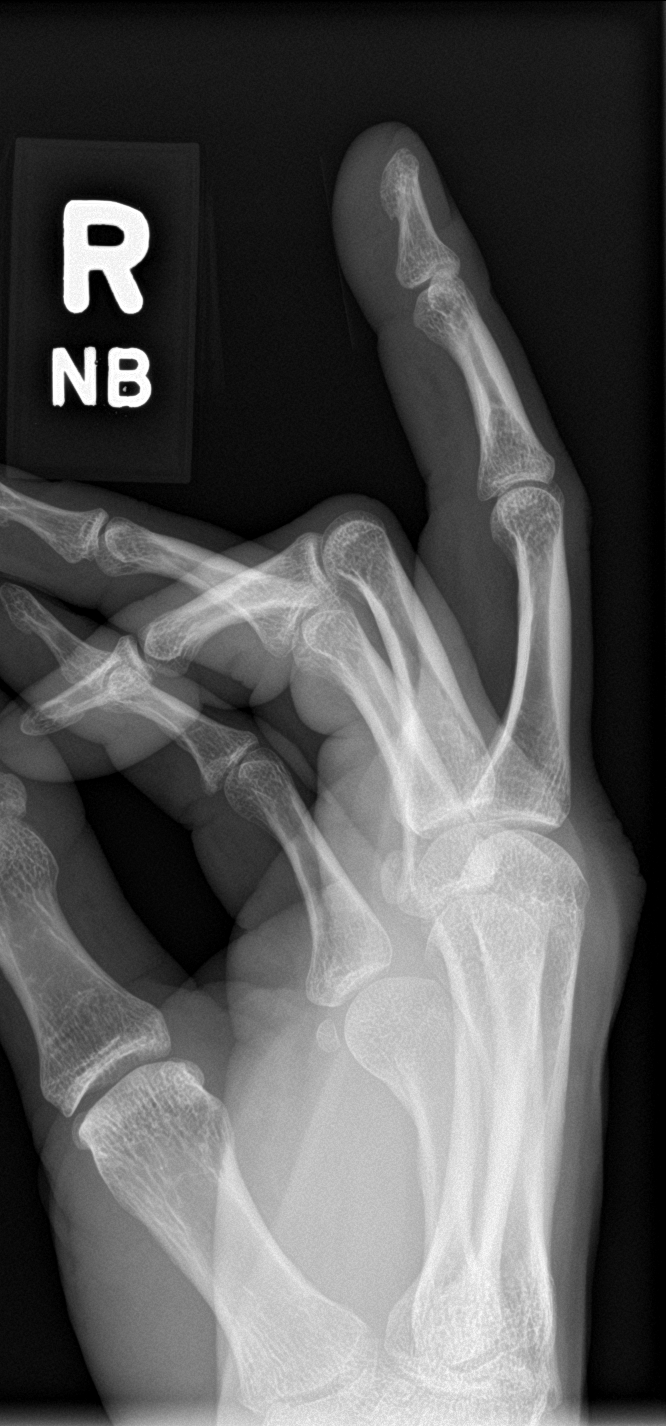

[3 of 3 positions shown; findings below may reference images not displayed]

FINDINGS: There is no evidence of an acute fracture or dislocation. There is
no evidence of arthropathy. A 6 mm benign-appearing sclerotic focus
is seen within the distal aspect of the second right metacarpal.
Soft tissues are unremarkable.
IMPRESSION: No acute osseous abnormality.

## 2022-05-29 ENCOUNTER — Encounter: Payer: Self-pay | Admitting: *Deleted

## 2023-10-16 ENCOUNTER — Encounter: Payer: Self-pay | Admitting: Sports Medicine

## 2023-10-29 ENCOUNTER — Ambulatory Visit
Admission: EM | Admit: 2023-10-29 | Discharge: 2023-10-29 | Disposition: A | Discharge status: Home / Self Care | Payer: PRIVATE HEALTH INSURANCE | Attending: Family Medicine | Admitting: Family Medicine

## 2023-10-29 DIAGNOSIS — T7840XA Allergy, unspecified, initial encounter: Secondary | ICD-10-CM | POA: Diagnosis not present

## 2023-10-29 DIAGNOSIS — R2242 Localized swelling, mass and lump, left lower limb: Secondary | ICD-10-CM

## 2023-10-29 DIAGNOSIS — T63441A Toxic effect of venom of bees, accidental (unintentional), initial encounter: Secondary | ICD-10-CM

## 2023-10-29 MED ORDER — FEXOFENADINE HCL 180 MG PO TABS: 180.0000 mg | ORAL_TABLET | Freq: Every day | ORAL | 0 refills | Status: AC

## 2023-10-29 MED ORDER — PREDNISONE 20 MG PO TABS: ORAL_TABLET | ORAL | 0 refills | Status: AC

## 2023-10-29 MED ORDER — DOXYCYCLINE HYCLATE 100 MG PO CAPS: 100.0000 mg | ORAL_CAPSULE | Freq: Two times a day (BID) | ORAL | 0 refills | Status: AC

## 2023-10-29 NOTE — ED Provider Notes (Signed)
 [facility]    CSN: [id number] Arrival date & time: 10/29/23  0845      History   Chief Complaint Chief Complaint  Patient presents with   Insect Bite    Bee sting    HPI Mitchell Shaw is a 33 y.o. male.   HPI 33 year old male presents with bee sting to left foot that occurred yesterday Sunday, 10/28/2023.  PMH significant for anxiety and elevated blood pressure measurements.  Patient is accompanied by his mother this morning.  Past Medical History:  Diagnosis Date   Anxiety    History of anxiety 03/15/2017    Patient Active Problem List   Diagnosis Date Noted   Acute low back pain without sciatica 05/10/2022   Muscle spasm 05/10/2022   Erectile dysfunction 03/22/2021   Plantar fasciitis, right 03/22/2021   Injury of hand, right 07/08/2020   Nausea and vomiting 03/23/2020   Mass of the right middle finger 12/17/2018   History of anxiety 03/15/2017   Elevated blood pressure reading 03/15/2017   Rotator cuff tendonitis, right 03/15/2017    Past Surgical History:  Procedure Laterality Date   bone graph     WRIST SURGERY         Home Medications    Prior to Admission medications   Medication Sig Start Date End Date Taking? Authorizing Provider  doxycycline  (VIBRAMYCIN ) 100 MG capsule Take 1 capsule (100 mg total) by mouth 2 (two) times daily for 7 days. 10/29/23 11/05/23 Yes Teddy Sharper, FNP  fexofenadine  (ALLEGRA  ALLERGY) 180 MG tablet Take 1 tablet (180 mg total) by mouth daily for 15 days. 10/29/23 11/13/23 Yes Teddy Sharper, FNP  predniSONE  (DELTASONE ) 20 MG tablet Take 3 tabs PO daily x 5 days. 10/29/23  Yes Teddy Sharper, FNP    Family History Family History  Problem Relation Age of Onset   Hypertension Other    Rheum arthritis Mother    Stroke Other    Hypertension Other    Rheum arthritis Father    Rheum arthritis Brother     Social History Social History   Tobacco Use   Smoking status: Every Day    Current packs/day: 1.00     Average packs/day: 1 pack/day for 6.0 years (6.0 ttl pk-yrs)    Types: Cigarettes   Smokeless tobacco: Never  Vaping Use   Vaping status: Never Used  Substance Use Topics   Alcohol use: Yes    Comment: 6-8   Drug use: No     Allergies   Patient has no known allergies.   Review of Systems Review of Systems  Skin:  Positive for rash.     Physical Exam Triage Vital Signs ED Triage Vitals  Encounter Vitals Group     BP      Girls Systolic BP Percentile      Girls Diastolic BP Percentile      Boys Systolic BP Percentile      Boys Diastolic BP Percentile      Pulse      Resp      Temp      Temp src      SpO2      Weight      Height      Head Circumference      Peak Flow      Pain Score      Pain Loc      Pain Education      Exclude from Growth Chart    No data found.  Updated Vital Signs BP (!) 152/99 (BP Location: Right Arm)   Pulse 89   Temp 98.3 F (36.8 C) (Oral)   Resp 17   SpO2 98%   Physical Exam Vitals and nursing note reviewed.  Constitutional:      Appearance: Normal appearance. He is normal weight.  HENT:     Head: Normocephalic and atraumatic.     Mouth/Throat:     Mouth: Mucous membranes are moist.     Pharynx: Oropharynx is clear.  Eyes:     Extraocular Movements: Extraocular movements intact.     Pupils: Pupils are equal, round, and reactive to light.  Cardiovascular:     Rate and Rhythm: Normal rate and regular rhythm.     Pulses: Normal pulses.     Heart sounds: Normal heart sounds.  Pulmonary:     Effort: Pulmonary effort is normal.     Breath sounds: Normal breath sounds. No wheezing, rhonchi or rales.  Musculoskeletal:        General: Normal range of motion.  Skin:    General: Skin is warm and dry.     Comments: Left foot dorsum: Moderate soft tissue swelling noted TTP, mildly erythematous-please see image below patient using crutches today in clinic.  Neurological:     General: No focal deficit present.     Mental Status:  He is alert and oriented to person, place, and time. Mental status is at baseline.  Psychiatric:        Mood and Affect: Mood normal.        Behavior: Behavior normal.      UC Treatments / Results  Labs (all labs ordered are listed, but only abnormal results are displayed) Labs Reviewed - No data to display  EKG   Radiology No results found.  Procedures Procedures (including critical care time)  Medications Ordered in UC Medications - No data to display  Initial Impression / Assessment and Plan / UC Course  I have reviewed the triage vital signs and the nursing notes.  Pertinent labs & imaging results that were available during my care of the patient were reviewed by me and considered in my medical decision making (see chart for details).     MDM: 1.  Localized swelling of left foot-Rx'd prednisone  20 mg tablet: Take 3 tablets p.o. daily x 5 days.  Advised RICE for 3 days (3 times daily); 2.  Allergic reaction, initial encounter-Rx'd Allegra  180 mg tablet: Take 1 tablet daily x 5 days; 3.  Bee sting reaction, accidental or unintentional, initial encounter-Rx'd doxycycline  100 mg capsule: Take 1 capsule twice daily x 7 days. Advised patient to take medications as directed with food to completion.  Advised patient to take prednisone  and Allegra  with first dose of doxycycline  for the next 5 days.  Advised may use Allegra  as needed for bee stings/wasp/hornet stings in the future.  Advised patient may RICE left foot for 30 minutes 3 times daily for the next 3 days to reduce swelling.  Encouraged increase daily water intake to 64 ounces per day while taking these medications.  Advised if symptoms worsen and/or unresolved please follow-up with your PCP or here for further evaluation.  Work note provided to patient prior to discharge today per her request.  Patient discharged home, hemodynamically stable. Final Clinical Impressions(s) / UC Diagnoses   Final diagnoses:  Bug bite with  infection, initial encounter  Bee sting reaction, accidental or unintentional, initial encounter  Localized swelling of left foot  Allergic  reaction, initial encounter     Discharge Instructions      Advised patient to take medications as directed with food to completion.  Advised patient to take prednisone  and Allegra  with first dose of doxycycline  for the next 5 days.  Advised may use Allegra  as needed for bee stings/wasp/hornet stings in the future.  Advised patient may RICE left foot for 30 minutes 3 times daily for the next 3 days to reduce swelling.  Encouraged increase daily water intake to 64 ounces per day while taking these medications.  Advised if symptoms worsen and/or unresolved please follow-up with your PCP or here for further evaluation.     ED Prescriptions     Medication Sig Dispense Auth. Provider   predniSONE  (DELTASONE ) 20 MG tablet Take 3 tabs PO daily x 5 days. 15 tablet Shelitha Magley, FNP   fexofenadine  (ALLEGRA  ALLERGY) 180 MG tablet Take 1 tablet (180 mg total) by mouth daily for 15 days. 15 tablet Mariangela Heldt, FNP   doxycycline  (VIBRAMYCIN ) 100 MG capsule Take 1 capsule (100 mg total) by mouth 2 (two) times daily for 7 days. 14 capsule Ghina Bittinger, FNP      PDMP not reviewed this encounter.   Teddy Sharper, FNP 10/29/23 [phone]

## 2023-10-29 NOTE — ED Triage Notes (Signed)
 Pt c/o LT foot pain and swelling due to bee sting that occurred yesterday. Benedryl and tylenol  prn.

## 2023-10-29 NOTE — Discharge Instructions (Addendum)
 Advised patient to take medications as directed with food to completion.  Advised patient to take prednisone  and Allegra  with first dose of doxycycline  for the next 5 days.  Advised may use Allegra  as needed for bee stings/wasp/hornet stings in the future.  Advised patient may RICE left foot for 30 minutes 3 times daily for the next 3 days to reduce swelling.  Encouraged increase daily water intake to 64 ounces per day while taking these medications.  Advised if symptoms worsen and/or unresolved please follow-up with your PCP or here for further evaluation.

## 2023-10-30 ENCOUNTER — Telehealth: Payer: Self-pay | Admitting: Emergency Medicine

## 2023-10-30 NOTE — Telephone Encounter (Signed)
 Spoke with patient states that he is doing about the same.  Ankle still swollen but will continue medication as prescribed and follow up as needed.
